# Patient Record
Sex: Female | Born: 1965 | Race: White | Hispanic: No | Marital: Single | Smoking: Never smoker
Health system: Southern US, Community
[De-identification: ages and names within clinical notes are randomized; demographics above are authoritative.]

---

## 1998-05-22 ENCOUNTER — Other Ambulatory Visit: Admission: RE | Admit: 1998-05-22 | Discharge: 1998-05-22 | Payer: Self-pay | Admitting: Obstetrics and Gynecology

## 1999-11-11 ENCOUNTER — Other Ambulatory Visit: Admission: RE | Admit: 1999-11-11 | Discharge: 1999-11-11 | Payer: Self-pay | Admitting: Otolaryngology

## 1999-11-11 ENCOUNTER — Encounter (INDEPENDENT_AMBULATORY_CARE_PROVIDER_SITE_OTHER): Payer: Self-pay

## 2000-03-18 ENCOUNTER — Encounter: Payer: Self-pay | Admitting: *Deleted

## 2000-03-18 ENCOUNTER — Encounter: Admission: RE | Admit: 2000-03-18 | Discharge: 2000-03-18 | Payer: Self-pay | Admitting: *Deleted

## 2003-08-18 ENCOUNTER — Other Ambulatory Visit: Admission: RE | Admit: 2003-08-18 | Discharge: 2003-08-18 | Payer: Self-pay | Admitting: Obstetrics and Gynecology

## 2004-02-11 ENCOUNTER — Emergency Department (HOSPITAL_COMMUNITY): Admission: EM | Admit: 2004-02-11 | Discharge: 2004-02-11 | Payer: Self-pay | Admitting: Family Medicine

## 2004-11-14 ENCOUNTER — Emergency Department (HOSPITAL_COMMUNITY): Admission: EM | Admit: 2004-11-14 | Discharge: 2004-11-14 | Payer: Self-pay | Admitting: Family Medicine

## 2006-03-05 ENCOUNTER — Ambulatory Visit (HOSPITAL_COMMUNITY): Admission: RE | Admit: 2006-03-05 | Discharge: 2006-03-05 | Payer: Self-pay | Admitting: Family Medicine

## 2008-12-06 ENCOUNTER — Ambulatory Visit: Payer: Self-pay | Admitting: Family Medicine

## 2009-01-01 ENCOUNTER — Ambulatory Visit: Payer: Self-pay | Admitting: Family Medicine

## 2009-01-02 ENCOUNTER — Encounter: Admission: RE | Admit: 2009-01-02 | Discharge: 2009-01-02 | Payer: Self-pay | Admitting: Family Medicine

## 2010-02-15 ENCOUNTER — Ambulatory Visit: Payer: Self-pay | Admitting: Family Medicine

## 2010-05-02 NOTE — Assessment & Plan Note (Signed)
Summary: FLU SHOT/EVM  Nurse Visit   Immunizations Administered:  Influenza Vaccine:    Vaccine Type: FLUMIST    Site: NASAL    Mfr: MEDIMMUNE    Dose: 0.2ML    Route: INTRANASAL    Given by: Levonne Spiller EMT-P    Exp. Date: 02/24/2010    Lot #: [id number]    VIS given: 10/23/09 version given February 15, 2010.   Immunizations Administered:  Influenza Vaccine:    Vaccine Type: FLUMIST    Site: NASAL    Mfr: MEDIMMUNE    Dose: 0.2ML    Route: INTRANASAL    Given by: Levonne Spiller EMT-P    Exp. Date: 02/24/2010    Lot #: [id number]    VIS given: 10/23/09 version given February 15, 2010.  Flu Vaccine Consent Questions:    Do you have a history of severe allergic reactions to this vaccine? no    Any prior history of allergic reactions to egg and/or gelatin? no    Do you have a sensitivity to the preservative Thimersol? no    Do you have a past history of Guillan-Barre Syndrome? no    Do you currently have an acute febrile illness? no    Have you ever had a severe reaction to latex? no    Vaccine information given and explained to patient? yes    Are you currently pregnant? no

## 2010-05-02 NOTE — Letter (Signed)
Summary: Generic Letter  [facility]  [address]   [place], [place] [address]   Phone: [phone]  Fax: [phone]    02/15/2010  [facility] [address] [place], [place]  [address]  TO WHOM IT MAY CONCERN,  THE ABOVE PATIENT RECEIVE FLUMIST FLU VACCINATION ON February 15, 2010 at the [facility].   Please call us with questions.      Sincerely,   Standley Dakins MD

## 2010-05-07 ENCOUNTER — Ambulatory Visit: Payer: BC Managed Care – PPO | Attending: Orthopaedic Surgery | Admitting: Rehabilitation

## 2010-05-07 DIAGNOSIS — M25619 Stiffness of unspecified shoulder, not elsewhere classified: Secondary | ICD-10-CM | POA: Insufficient documentation

## 2010-05-07 DIAGNOSIS — M25519 Pain in unspecified shoulder: Secondary | ICD-10-CM | POA: Insufficient documentation

## 2010-05-07 DIAGNOSIS — IMO0001 Reserved for inherently not codable concepts without codable children: Secondary | ICD-10-CM | POA: Insufficient documentation

## 2010-05-07 DIAGNOSIS — R293 Abnormal posture: Secondary | ICD-10-CM | POA: Insufficient documentation

## 2010-05-09 ENCOUNTER — Ambulatory Visit: Payer: BC Managed Care – PPO | Admitting: Physical Therapy

## 2010-05-13 ENCOUNTER — Ambulatory Visit: Payer: BC Managed Care – PPO | Admitting: Physical Therapy

## 2010-05-16 ENCOUNTER — Ambulatory Visit: Payer: BC Managed Care – PPO | Admitting: Physical Therapy

## 2010-05-20 ENCOUNTER — Ambulatory Visit: Payer: BC Managed Care – PPO | Admitting: Physical Therapy

## 2010-05-23 ENCOUNTER — Encounter: Payer: BC Managed Care – PPO | Admitting: Physical Therapy

## 2010-05-28 ENCOUNTER — Ambulatory Visit: Payer: BC Managed Care – PPO | Admitting: Physical Therapy

## 2010-06-12 ENCOUNTER — Encounter: Payer: BC Managed Care – PPO | Admitting: Rehabilitation

## 2010-11-11 ENCOUNTER — Telehealth: Payer: Self-pay | Admitting: Family Medicine

## 2010-11-11 MED ORDER — ACYCLOVIR 5 % EX OINT: TOPICAL_OINTMENT | CUTANEOUS | Status: AC

## 2010-11-11 NOTE — Telephone Encounter (Signed)
Zovirax renewed

## 2011-12-17 ENCOUNTER — Telehealth: Payer: Self-pay | Admitting: Family Medicine

## 2011-12-18 NOTE — Telephone Encounter (Signed)
Called pt left message to call back with strength so it can be sent in

## 2011-12-18 NOTE — Telephone Encounter (Signed)
Find out the strength and call it in.

## 2011-12-19 ENCOUNTER — Telehealth: Payer: Self-pay | Admitting: Family Medicine

## 2011-12-19 MED ORDER — TRIAMCINOLONE ACETONIDE 0.1 % EX CREA: TOPICAL_CREAM | Freq: Two times a day (BID) | CUTANEOUS | Status: DC

## 2011-12-19 NOTE — Telephone Encounter (Signed)
Sent cream in

## 2012-05-15 ENCOUNTER — Other Ambulatory Visit: Payer: Self-pay

## 2013-02-03 ENCOUNTER — Other Ambulatory Visit: Payer: Self-pay

## 2013-09-05 ENCOUNTER — Other Ambulatory Visit: Payer: Self-pay | Admitting: Occupational Medicine

## 2013-09-05 ENCOUNTER — Ambulatory Visit: Payer: Self-pay

## 2013-09-05 DIAGNOSIS — R52 Pain, unspecified: Secondary | ICD-10-CM

## 2013-09-07 ENCOUNTER — Other Ambulatory Visit: Payer: Self-pay | Admitting: Family Medicine

## 2013-09-07 MED ORDER — TRIAMCINOLONE ACETONIDE 0.1 % EX CREA: TOPICAL_CREAM | Freq: Two times a day (BID) | CUTANEOUS | Status: DC

## 2014-12-19 ENCOUNTER — Other Ambulatory Visit: Payer: Self-pay | Admitting: Podiatry

## 2014-12-19 DIAGNOSIS — M79671 Pain in right foot: Secondary | ICD-10-CM

## 2014-12-19 DIAGNOSIS — M25571 Pain in right ankle and joints of right foot: Secondary | ICD-10-CM

## 2014-12-28 ENCOUNTER — Ambulatory Visit
Admission: RE | Admit: 2014-12-28 | Discharge: 2014-12-28 | Disposition: A | Discharge status: Home / Self Care | Payer: BLUE CROSS/BLUE SHIELD | Source: Ambulatory Visit | Attending: Podiatry | Admitting: Podiatry

## 2014-12-28 DIAGNOSIS — M25571 Pain in right ankle and joints of right foot: Secondary | ICD-10-CM

## 2014-12-28 DIAGNOSIS — M79671 Pain in right foot: Secondary | ICD-10-CM

## 2016-06-18 ENCOUNTER — Ambulatory Visit (INDEPENDENT_AMBULATORY_CARE_PROVIDER_SITE_OTHER): Payer: BLUE CROSS/BLUE SHIELD | Admitting: Family Medicine

## 2016-06-18 ENCOUNTER — Encounter: Payer: Self-pay | Admitting: Family Medicine

## 2016-06-18 VITALS — BP 120/76 | HR 83 | Ht 68.5 in | Wt 209.0 lb

## 2016-06-18 DIAGNOSIS — L309 Dermatitis, unspecified: Secondary | ICD-10-CM

## 2016-06-18 MED ORDER — CRISABOROLE 2 % EX OINT: 1.0000 "application " | TOPICAL_OINTMENT | Freq: Two times a day (BID) | CUTANEOUS | 11 refills | Status: AC

## 2016-06-18 NOTE — Progress Notes (Signed)
   Subjective:    Patient ID: Erika Yu, female    DOB: June 30, 1965, 51 y.o.   MRN: [id number]  HPI  Erika Yu a previous patient of mine who wants to come back to my care. 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 She has been using triamcinolone to hp with her underlying eczema which has helped elected try a new topical medication.   Review of Systems     Objective:   Physical Exam  Alert and in no distress. Hands appear normal.       Assessment & Plan:  Eczema, unspecified type - Plan: Crisaborole (EUCRISA) 2 % OINT  I will try her on the new medication. Also recommend she come back for complete exam as she is now postmenopausal. She will set up an appointment. She is to bring in her immunization update information.

## 2017-03-07 ENCOUNTER — Ambulatory Visit (HOSPITAL_COMMUNITY)
Admission: EM | Admit: 2017-03-07 | Discharge: 2017-03-07 | Disposition: A | Discharge status: Home / Self Care | Payer: BLUE CROSS/BLUE SHIELD | Attending: Physician Assistant | Admitting: Physician Assistant

## 2017-03-07 ENCOUNTER — Encounter (HOSPITAL_COMMUNITY): Payer: Self-pay | Admitting: *Deleted

## 2017-03-07 DIAGNOSIS — R3915 Urgency of urination: Secondary | ICD-10-CM | POA: Insufficient documentation

## 2017-03-07 DIAGNOSIS — R300 Dysuria: Secondary | ICD-10-CM | POA: Insufficient documentation

## 2017-03-07 DIAGNOSIS — N3090 Cystitis, unspecified without hematuria: Secondary | ICD-10-CM | POA: Diagnosis not present

## 2017-03-07 LAB — POCT URINALYSIS DIP (DEVICE)
GLUCOSE, UA: 250 mg/dL — AB
KETONES UR: 15 mg/dL — AB
NITRITE: POSITIVE — AB
PROTEIN: 100 mg/dL — AB
Specific Gravity, Urine: 1.01 (ref 1.005–1.030)
UROBILINOGEN UA: 4 mg/dL — AB (ref 0.0–1.0)
pH: 5 (ref 5.0–8.0)

## 2017-03-07 MED ORDER — CEPHALEXIN 500 MG PO CAPS: 500.0000 mg | ORAL_CAPSULE | Freq: Two times a day (BID) | ORAL | 0 refills | Status: AC

## 2017-03-07 NOTE — ED Provider Notes (Signed)
[facility]    CSN: [id number] Arrival date & time: 03/07/17  1335     History   Chief Complaint Chief Complaint  Patient presents with  . Urinary Urgency  . Dysuria    HPI ROLLA SERVIDIO is a 51 y.o. female.   51 year old female comes in for 1 week history of UTI symptoms with 1 day of dysuria. States she felt symptoms starting 1 week ago, thought it may be due to irritation as she was traveling and was often in wet bathing suit. She increased water intake and started cranberry juice. This morning, she woke up with worsening symptoms and dysuria. She started AZO with some relief of symptoms. She denies abdominal pain, nausea, vomiting. Denies fever, chills, night sweats.        History reviewed. No pertinent past medical history.  There are no active problems to display for this patient.   History reviewed. No pertinent surgical history.  OB History    No data available       Home Medications    Prior to Admission medications   Medication Sig Start Date End Date Taking? Authorizing Provider  Ascorbic Acid (VITAMIN C) 1000 MG tablet Take 1,000 mg by mouth daily.   Yes [provider]  aspirin EC 81 MG tablet Take 81 mg by mouth daily.   Yes [provider]  Calcium Carb-Cholecalciferol (CALCIUM-VITAMIN D) 500-200 MG-UNIT tablet Take 1 tablet by mouth daily.   Yes [provider]  glucosamine-chondroitin 500-400 MG tablet Take 1 tablet by mouth 3 (three) times daily.   Yes [provider]  loratadine (CLARITIN) 10 MG tablet Take 10 mg by mouth daily.   Yes [provider]  Multiple Vitamins-Minerals (MULTIVITAMIN WITH MINERALS) tablet Take 1 tablet by mouth daily.   Yes [provider]  cephALEXin (KEFLEX) 500 MG capsule Take 1 capsule (500 mg total) by mouth 2 (two) times daily for 7 days. 03/07/17 03/14/17  Ok Edwards, PA-C  Crisaborole (EUCRISA) 2 % OINT Apply 1 application topically 2 (two) times  daily. 06/18/16   Denita Lung, MD    Family History History reviewed. No pertinent family history.  Social History Social History   Tobacco Use  . Smoking status: Never Smoker  . Smokeless tobacco: Never Used  Substance Use Topics  . Alcohol use: Not on file  . Drug use: Not on file     Allergies   Citric acid   Review of Systems Review of Systems  Reason unable to perform ROS: See HPI as above.     Physical Exam Triage Vital Signs ED Triage Vitals [03/07/17 1504]  Enc Vitals Group     BP 129/81     Pulse Rate 81     Resp 16     Temp 98.1 F (36.7 C)     Temp Source Oral     SpO2 100 %     Weight      Height      Head Circumference      Peak Flow      Pain Score      Pain Loc      Pain Edu?      Excl. in [place]?    No data found.  Updated Vital Signs BP 129/81 (BP Location: Left Arm)   Pulse 81   Temp 98.1 F (36.7 C) (Oral)   Resp 16   SpO2 100%   Physical Exam  Constitutional: She is  oriented to person, place, and time. She appears well-developed and well-nourished. No distress.  Eyes: Conjunctivae are normal. Pupils are equal, round, and reactive to light.  Cardiovascular: Normal rate, regular rhythm and normal heart sounds. Exam reveals no gallop and no friction rub.  No murmur heard. Pulmonary/Chest: Effort normal and breath sounds normal. She has no wheezes. She has no rales.  Abdominal: Soft. Bowel sounds are normal. She exhibits no distension. There is no tenderness. There is no rebound, no guarding and no CVA tenderness.  Neurological: She is alert and oriented to person, place, and time.  Skin: Skin is warm and dry.     UC Treatments / Results  Labs (all labs ordered are listed, but only abnormal results are displayed) Labs Reviewed  POCT URINALYSIS DIP (DEVICE) - Abnormal; Notable for the following components:      Result Value   Glucose, UA 250 (*)    Bilirubin Urine SMALL (*)    Ketones, ur 15 (*)    Hgb urine dipstick  MODERATE (*)    Protein, ur 100 (*)    Urobilinogen, UA 4.0 (*)    Nitrite POSITIVE (*)    Leukocytes, UA LARGE (*)    All other components within normal limits  URINE CULTURE    EKG  EKG Interpretation None       Radiology No results found.  Procedures Procedures (including critical care time)  Medications Ordered in UC Medications - No data to display   Initial Impression / Assessment and Plan / UC Course  I have reviewed the triage vital signs and the nursing notes.  Pertinent labs & imaging results that were available during my care of the patient were reviewed by me and considered in my medical decision making (see chart for details).    Urine dipstick positive for UTI. Start antibiotics as directed. Push fluids. Urine culture sent. Return precautions given.   Final Clinical Impressions(s) / UC Diagnoses   Final diagnoses:  Cystitis    ED Discharge Orders        Ordered    cephALEXin (KEFLEX) 500 MG capsule  2 times daily     03/07/17 1539        Ok Edwards, [place] 03/07/17 1840

## 2017-03-07 NOTE — Discharge Instructions (Signed)
Your urine was positive for an urinary tract infection. Start keflex as directed. Keep hydrated, your urine should be clear to pale yellow in color. Monitor for any worsening of symptoms, fever, worsening abdominal pain, nausea/vomiting, flank pain, follow up for reevaluation.  ° °

## 2017-03-07 NOTE — ED Notes (Signed)
Patient sent to the restroom, obtained a clean & dirty specimen. Specimens in lab

## 2017-03-07 NOTE — ED Triage Notes (Signed)
Patient reports urinary frequency and dysuria. Denies abdominal pain, flank pain, or fever.

## 2017-03-09 LAB — URINE CULTURE: Culture: 50000 — AB

## 2017-03-17 ENCOUNTER — Telehealth: Payer: Self-pay | Admitting: Family Medicine

## 2017-03-17 MED ORDER — NITROFURANTOIN MONOHYD MACRO 100 MG PO CAPS: 100.0000 mg | ORAL_CAPSULE | Freq: Two times a day (BID) | ORAL | 0 refills | Status: DC

## 2017-03-17 NOTE — Telephone Encounter (Signed)
  Finished meds for uti on Friday, she was feeling 100% better earlier last week . Symptoms started back today  Please call  Started OTC pyridium this morning

## 2017-03-17 NOTE — Telephone Encounter (Signed)
Let her know that I called the medication and but would like her to come in this week to recheck her urine.  It did show that she was spilling sugar so we need to follow-up on that

## 2017-03-18 ENCOUNTER — Telehealth: Payer: Self-pay | Admitting: Family Medicine

## 2017-03-18 NOTE — Telephone Encounter (Signed)
Lmtcb.

## 2017-03-18 NOTE — Telephone Encounter (Signed)
Pt aware. Erika Yu

## 2017-03-20 NOTE — Telephone Encounter (Signed)
Error

## 2018-01-18 ENCOUNTER — Encounter: Payer: Self-pay | Admitting: Family Medicine

## 2018-01-18 ENCOUNTER — Ambulatory Visit: Payer: BLUE CROSS/BLUE SHIELD | Admitting: Family Medicine

## 2018-01-18 VITALS — BP 110/76 | HR 70 | Temp 98.0°F | Wt 207.2 lb

## 2018-01-18 DIAGNOSIS — D563 Thalassemia minor: Secondary | ICD-10-CM | POA: Diagnosis not present

## 2018-01-18 DIAGNOSIS — R8290 Unspecified abnormal findings in urine: Secondary | ICD-10-CM

## 2018-01-18 DIAGNOSIS — N3000 Acute cystitis without hematuria: Secondary | ICD-10-CM | POA: Diagnosis not present

## 2018-01-18 DIAGNOSIS — Z1211 Encounter for screening for malignant neoplasm of colon: Secondary | ICD-10-CM | POA: Diagnosis not present

## 2018-01-18 LAB — CBC WITH DIFFERENTIAL/PLATELET
Basophils Absolute: 0.1 10*3/uL (ref 0.0–0.2)
Basos: 1 %
EOS (ABSOLUTE): 0.1 10*3/uL (ref 0.0–0.4)
EOS: 1 %
HEMATOCRIT: 33.8 % — AB (ref 34.0–46.6)
HEMOGLOBIN: 10.4 g/dL — AB (ref 11.1–15.9)
Immature Grans (Abs): 0 10*3/uL (ref 0.0–0.1)
Immature Granulocytes: 0 %
LYMPHS ABS: 1.9 10*3/uL (ref 0.7–3.1)
Lymphs: 26 %
MCH: 19.1 pg — AB (ref 26.6–33.0)
MCHC: 30.8 g/dL — AB (ref 31.5–35.7)
MCV: 62 fL — ABNORMAL LOW (ref 79–97)
MONOCYTES: 10 %
MONOS ABS: 0.7 10*3/uL (ref 0.1–0.9)
NEUTROS ABS: 4.5 10*3/uL (ref 1.4–7.0)
Neutrophils: 62 %
Platelets: 435 10*3/uL (ref 150–450)
RBC: 5.45 x10E6/uL — ABNORMAL HIGH (ref 3.77–5.28)
RDW: 15 % (ref 12.3–15.4)
WBC: 7.3 10*3/uL (ref 3.4–10.8)

## 2018-01-18 LAB — COMPREHENSIVE METABOLIC PANEL
ALBUMIN: 4.6 g/dL (ref 3.5–5.5)
ALK PHOS: 83 IU/L (ref 39–117)
ALT: 17 IU/L (ref 0–32)
AST: 17 IU/L (ref 0–40)
Albumin/Globulin Ratio: 2.2 (ref 1.2–2.2)
BILIRUBIN TOTAL: 0.5 mg/dL (ref 0.0–1.2)
BUN / CREAT RATIO: 19 (ref 9–23)
BUN: 21 mg/dL (ref 6–24)
CHLORIDE: 102 mmol/L (ref 96–106)
CO2: 22 mmol/L (ref 20–29)
Calcium: 9.6 mg/dL (ref 8.7–10.2)
Creatinine, Ser: 1.09 mg/dL — ABNORMAL HIGH (ref 0.57–1.00)
GFR calc Af Amer: 67 mL/min/{1.73_m2} (ref >59)
GFR calc non Af Amer: 59 mL/min/{1.73_m2} — ABNORMAL LOW (ref >59)
GLOBULIN, TOTAL: 2.1 g/dL (ref 1.5–4.5)
Glucose: 101 mg/dL — ABNORMAL HIGH (ref 65–99)
Potassium: 3.7 mmol/L (ref 3.5–5.2)
SODIUM: 141 mmol/L (ref 134–144)
Total Protein: 6.7 g/dL (ref 6.0–8.5)

## 2018-01-18 LAB — POCT URINALYSIS DIP (PROADVANTAGE DEVICE)
Blood, UA: NEGATIVE
Glucose, UA: 100 mg/dL — AB
Nitrite, UA: POSITIVE — AB
PROTEIN UA: NEGATIVE mg/dL
Specific Gravity, Urine: 1.2
pH, UA: 5 (ref 5.0–8.0)

## 2018-01-18 MED ORDER — NITROFURANTOIN MONOHYD MACRO 100 MG PO CAPS: 100.0000 mg | ORAL_CAPSULE | Freq: Two times a day (BID) | ORAL | 0 refills | Status: DC

## 2018-01-18 NOTE — Addendum Note (Signed)
Addended by: Elyse Jarvis on: 01/18/2018 01:35 PM   Modules accepted: Orders

## 2018-01-18 NOTE — Progress Notes (Signed)
   Subjective:    Patient ID: Erika Yu, female    DOB: 03/01/1966, 52 y.o.   MRN: [id number]  HPI She has a 3-day history of dysuria, slight nausea with frequency.  She has a previous history of UTI.  No fever, chills, abdominal pain.  So I do not think, that there is she also has noted some darkening of the color of her urine.  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  She does take a multivitamin but does limit her iron intake.  She would also like colon cancer screening.   Review of Systems     Objective:   Physical Exam Alert and in no distress otherwise not examined.  Urine dipstick was positive for leukocytes, nitrite, bilirubin and urobilinogen.       Assessment & Plan:  Thalassemia minor - Plan: CBC with Differential/Platelet, Comprehensive metabolic panel  Acute cystitis without hematuria - Plan: nitrofurantoin, macrocrystal-monohydrate, (MACROBID) 100 MG capsule  Abnormal urinalysis - Plan: CBC with Differential/Platelet, Comprehensive metabolic panel  Screening for colon cancer - Plan: Cologuard Follow-up as needed.

## 2018-01-19 ENCOUNTER — Encounter: Payer: Self-pay | Admitting: Family Medicine

## 2018-02-09 LAB — COLOGUARD: COLOGUARD: NEGATIVE

## 2018-02-10 ENCOUNTER — Telehealth: Payer: Self-pay

## 2018-02-10 NOTE — Telephone Encounter (Signed)
Called pt to advise cologuard results of negative. [place]

## 2018-06-10 ENCOUNTER — Other Ambulatory Visit: Payer: Self-pay

## 2018-06-10 ENCOUNTER — Ambulatory Visit (INDEPENDENT_AMBULATORY_CARE_PROVIDER_SITE_OTHER): Payer: BLUE CROSS/BLUE SHIELD | Admitting: Family Medicine

## 2018-06-10 ENCOUNTER — Encounter: Payer: Self-pay | Admitting: Family Medicine

## 2018-06-10 VITALS — BP 120/82 | HR 75 | Temp 98.0°F | Resp 16 | Wt 205.0 lb

## 2018-06-10 DIAGNOSIS — M720 Palmar fascial fibromatosis [Dupuytren]: Secondary | ICD-10-CM | POA: Diagnosis not present

## 2018-06-10 NOTE — Progress Notes (Signed)
   Subjective:    Patient ID: Erika Yu, female    DOB: Jan 29, 1966, 53 y.o.   MRN: [id number]  HPI She is here for evaluation of a lump in her left hand palmar surface.  It is now starting to interfere with her ADLs and she would like it definitively taken care of.  She also notes difficulty with tingling sensation in the fourth and fifth fingers on that side especially when she bends at the elbow.  This usually is relieved when she changes position.   Review of Systems     Objective:   Physical Exam Alert and in no distress.  8.5 cm slightly tender lesion is noted in the palm of the hand.  It does not seem to be attached to the underlying tendons.       Assessment & Plan:  Dupuytren's contracture of left hand - Plan: Ambulatory referral to Orthopedic Surgery Since she is having some pain with this and interfering with her ADLs, I will refer to hand surgery for further definitive care. I also informed her that the symptoms she is having with her fourth and fifth fingers are probably from ulnar nerve compression mainly at the elbow and to keep her elbow as straight as possible and avoid bending more than 90 degrees.

## 2018-08-18 ENCOUNTER — Other Ambulatory Visit: Payer: Self-pay | Admitting: Family Medicine

## 2018-08-18 ENCOUNTER — Other Ambulatory Visit: Payer: Self-pay

## 2018-08-18 ENCOUNTER — Ambulatory Visit: Payer: Self-pay

## 2018-08-18 DIAGNOSIS — M79622 Pain in left upper arm: Secondary | ICD-10-CM

## 2018-08-18 DIAGNOSIS — M898X2 Other specified disorders of bone, upper arm: Secondary | ICD-10-CM

## 2018-09-16 ENCOUNTER — Ambulatory Visit: Payer: BC Managed Care – PPO | Admitting: Medical

## 2018-09-16 ENCOUNTER — Ambulatory Visit
Admission: RE | Admit: 2018-09-16 | Discharge: 2018-09-16 | Disposition: A | Discharge status: Home / Self Care | Payer: BLUE CROSS/BLUE SHIELD | Source: Ambulatory Visit | Attending: Medical | Admitting: Medical

## 2018-09-16 ENCOUNTER — Encounter: Payer: Self-pay | Admitting: Medical

## 2018-09-16 ENCOUNTER — Other Ambulatory Visit: Payer: Self-pay

## 2018-09-16 VITALS — BP 120/74 | HR 70 | Temp 98.8°F | Resp 16 | Ht 68.0 in | Wt 203.2 lb

## 2018-09-16 DIAGNOSIS — M25512 Pain in left shoulder: Secondary | ICD-10-CM | POA: Diagnosis not present

## 2018-09-16 DIAGNOSIS — M25612 Stiffness of left shoulder, not elsewhere classified: Secondary | ICD-10-CM | POA: Diagnosis not present

## 2018-09-16 DIAGNOSIS — G8929 Other chronic pain: Secondary | ICD-10-CM | POA: Diagnosis not present

## 2018-09-16 DIAGNOSIS — M79622 Pain in left upper arm: Secondary | ICD-10-CM

## 2018-09-16 NOTE — Progress Notes (Signed)
Subjective: Chief Complaint  Patient presents with  . left arm pain    left arm pain upper arm shoulder pain X Oct 2019   Here for left arm pain, chronic.   Since her flu shot in october 2019, she has had some left arm issues.    initially had deltoid pain after flu shot.   But has continued to have some arm issues.  Splints her self to avoid moving the shoulder at times.  avoids a lot of over head motion to avoid pain.   Gets a stinger type pain in the shoulder.  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  Takes Ibuprofen daily, but doesn't want to do this fore ever.  Feels pressure points in the shoulder.  Doesn't hurt when she doesn't move it.  Uses TENS unit which seems to help dull the pain at times.  Works for [employer].  Went to [facility] for same recently 08/18/18, did course of prednisone dose pack, and they thought it was tendinitis.   Workers comp felt it was not related to flu shot or work related.   Workers comp released her, advised f/u with PCP.  movement makes it work in certain spots.    Lying on it hurts.   Winging it to certain spots helps, and not moving helps.   Has tried ice and heat, but not much improvement.     She is left-handed   ROS as in subjective   Objective: BP 120/74   Pulse 70   Temp 98.8 F (37.1 C) (Oral)   Resp 16   Ht 5\' 8"  (1.727 m)   Wt 203 lb 3.2 oz (92.2 kg)   SpO2 98%   BMI 30.90 kg/m   Gen: wd, wn, nad, white female Skin unremarkable Neck normal range of motion no mass no thyromegaly vomiting Left arm tender with deep palpation along anterior and anterior medial bicep and midshaft of arm, slight tenderness with biceps origin otherwise arms nontender to palpation, there is decreased range of motion with internal and external range of motion of shoulder, pain with empty can test, pain with apprehension test, mild pain with crossover test, pain with resisted abduction test, pain with Neer's test, otherwise arm  range of motion within normal limits, nontender otherwise, no swelling, no deformity other than Dupuytren's contracture of hand, rest of extremities unremarkable  Reviewed Humerus xray, left 08/18/18 Impression: Negative, no evidence of fracture or other focal bone lesions.  Soft tissues are unremarkable    Assessment: Encounter Diagnoses  Name Primary?  . Left upper arm pain Yes  . Chronic left shoulder pain   . Decreased ROM of left shoulder     Plan: We discussed her symptoms and concerns.  I do not think a flu shot in the fall had anything to do with her symptoms at this point  I suspect a rotator cuff issue.  We will send her for shoulder x-ray just to rule out other issues.  We will likely send her to physical therapy    Namita was seen today for left arm pain.  Diagnoses and all orders for this visit:  Left upper arm pain -     DG Shoulder Left; Future -     Ambulatory referral to Physical Therapy  Chronic left shoulder pain -     DG Shoulder Left; Future -     Ambulatory referral to Physical Therapy  Decreased ROM of left  shoulder -     DG Shoulder Left; Future -     Ambulatory referral to Physical Therapy

## 2018-09-16 NOTE — Patient Instructions (Signed)
Please go to [facility] for your left shoulder xray.   Their hours are 8am - 4:30 pm Monday - Friday.  Take your insurance card with you.  [facility] [phone]  [address], [address] [place], [place] [address]  [address] ,  [address]  

## 2018-09-30 ENCOUNTER — Other Ambulatory Visit: Payer: Self-pay | Admitting: Orthopedic Surgery

## 2018-09-30 DIAGNOSIS — R2232 Localized swelling, mass and lump, left upper limb: Secondary | ICD-10-CM

## 2018-10-27 ENCOUNTER — Ambulatory Visit
Admission: RE | Admit: 2018-10-27 | Discharge: 2018-10-27 | Disposition: A | Discharge status: Home / Self Care | Payer: BLUE CROSS/BLUE SHIELD | Source: Ambulatory Visit | Attending: Orthopedic Surgery | Admitting: Orthopedic Surgery

## 2018-10-27 DIAGNOSIS — R2232 Localized swelling, mass and lump, left upper limb: Secondary | ICD-10-CM

## 2018-11-08 ENCOUNTER — Encounter: Payer: Self-pay | Admitting: Family Medicine

## 2018-11-11 ENCOUNTER — Other Ambulatory Visit: Payer: Self-pay | Admitting: Orthopaedic Surgery

## 2018-11-11 ENCOUNTER — Encounter: Payer: Self-pay | Admitting: Orthopaedic Surgery

## 2018-11-11 ENCOUNTER — Ambulatory Visit (INDEPENDENT_AMBULATORY_CARE_PROVIDER_SITE_OTHER): Payer: BLUE CROSS/BLUE SHIELD | Admitting: Orthopaedic Surgery

## 2018-11-11 DIAGNOSIS — M25512 Pain in left shoulder: Secondary | ICD-10-CM

## 2018-11-11 DIAGNOSIS — G8929 Other chronic pain: Secondary | ICD-10-CM

## 2018-11-11 DIAGNOSIS — M7502 Adhesive capsulitis of left shoulder: Secondary | ICD-10-CM

## 2018-11-11 MED ORDER — HYDROCODONE-ACETAMINOPHEN 5-325 MG PO TABS: 1.0000 | ORAL_TABLET | Freq: Four times a day (QID) | ORAL | 0 refills | Status: DC | PRN

## 2018-11-11 MED ORDER — METHYLPREDNISOLONE 4 MG PO TABS: ORAL_TABLET | ORAL | 0 refills | Status: DC

## 2018-11-11 NOTE — Progress Notes (Signed)
Office Visit Note   Patient: Erika Yu           Date of Birth: 30-Mar-1966           MRN: [id number] Visit Date: 11/11/2018              Requested by: Denita Lung, MD [place],  [place] [address] PCP: Denita Lung, MD   Assessment & Plan: Visit Diagnoses:  1. Chronic left shoulder pain   2. Adhesive capsulitis of left shoulder     Plan: Given the severity of her left frozen shoulder we are recommending a manipulation under anesthesia followed by an arthroscopic intervention with capsular release with extensive debridement and subacromial decompression.  Given the failure of conservative treatment and given her significant limitations in mobility of that left shoulder this is the next obvious step and is clinically warranted at this point.  We had a long and thorough discussion about what the surgery involves.  The risk and benefits were explained in detail.  We would certainly want her to get into physical therapy even in the afternoon of surgery and for the next week to get the shoulder moving.  We would then see her back at 1 week postoperative for suture removal.  All question concerns were answered addressed.  We will work on getting surgery scheduled.  Follow-Up Instructions: Return for 1 week post-op.   Orders:  No orders of the defined types were placed in this encounter.  No orders of the defined types were placed in this encounter.     Procedures: No procedures performed   Clinical Data: No additional findings.   Subjective: Chief Complaint  Patient presents with   Left Shoulder - Pain  The patient comes in today with severe shoulder arthrofibrosis and adhesive capsulitis.  This is of the left shoulder.  This is been going on for about 10 months now.  She is tried a long course of physical therapy to try to get her shoulder moving better.  I have all the notes from therapy that showed the extent of her progress and lack of progress  with therapy.  Her pain is daily.  This all started after having a flu shot which can certainly trigger inflammatory spots near the shoulder and cause pain that then led to her not moving her shoulder which then eventually led to the adhesive capsulitis.  She denies any neck pain and denies any numbness and tingling in her hand.  She has had similar issues with her right shoulder in the past that had to be treated eventually with surgery.  HPI  Review of Systems She currently denies any headache, chest pain, shortness of breath, fever, chills, nausea, vomiting  Objective: Vital Signs: There were no vitals taken for this visit.  Physical Exam She is alert and oriented x3 and in no acute distress Ortho Exam Examination of her left shoulder shows severe limitations in her motion of the shoulder.  I cannot really abduct her much past 90 degrees.  Her external rotation is severely limited.  Her internal rotation with adduction is only at the level of her gluteus area.  Her right shoulder motion is full and normal. Specialty Comments:  No specialty comments available.  Imaging: No results found.   PMFS History: Patient Active Problem List   Diagnosis Date Noted   Adhesive capsulitis of left shoulder 11/11/2018   Left upper arm pain 09/16/2018   Chronic left shoulder pain 09/16/2018  Decreased ROM of left shoulder 09/16/2018   History reviewed. No pertinent past medical history.  History reviewed. No pertinent family history.  History reviewed. No pertinent surgical history. Social History   Occupational History   Not on file  Tobacco Use   Smoking status: Never Smoker   Smokeless tobacco: Never Used  Substance and Sexual Activity   Alcohol use: Not on file   Drug use: Not on file   Sexual activity: Not on file

## 2018-11-25 ENCOUNTER — Other Ambulatory Visit: Payer: Self-pay | Admitting: Orthopaedic Surgery

## 2018-11-25 DIAGNOSIS — M24612 Ankylosis, left shoulder: Secondary | ICD-10-CM

## 2018-11-25 MED ORDER — OXYCODONE HCL 5 MG PO TABS: 5.0000 mg | ORAL_TABLET | Freq: Four times a day (QID) | ORAL | 0 refills | Status: DC | PRN

## 2018-12-02 ENCOUNTER — Ambulatory Visit (INDEPENDENT_AMBULATORY_CARE_PROVIDER_SITE_OTHER): Payer: BC Managed Care – PPO | Admitting: Orthopaedic Surgery

## 2018-12-02 ENCOUNTER — Encounter: Payer: Self-pay | Admitting: Orthopaedic Surgery

## 2018-12-02 DIAGNOSIS — M7502 Adhesive capsulitis of left shoulder: Secondary | ICD-10-CM

## 2018-12-02 DIAGNOSIS — Z9889 Other specified postprocedural states: Secondary | ICD-10-CM

## 2018-12-02 MED ORDER — OXYCODONE HCL 5 MG PO TABS: 5.0000 mg | ORAL_TABLET | Freq: Four times a day (QID) | ORAL | 0 refills | Status: DC | PRN

## 2018-12-02 MED ORDER — METHYLPREDNISOLONE 4 MG PO TABS: ORAL_TABLET | ORAL | 0 refills | Status: DC

## 2018-12-02 NOTE — Progress Notes (Signed)
Patient is 1 week out from manipulation under anesthesia of her left shoulder due to arthrofibrosis.  We also found significant synovitis in the glenohumeral joint and bursitis in the cervical space with tendinosis of the rotator cuff.  This is something that had slowly worsened over a year.  I was pleased with the manipulation aspect of her surgery in terms of getting her motion back.  She has been in aggressive physical therapy since then and has not gotten her full motion back as of yet.  She is taking anti-inflammatories and pain medication.  Her small incisions look good over her left shoulder and sutures have been removed.  There is still limitations with forward flexion and abduction of her left shoulder but there is some improvements from what she had preoperative.  She will continue aggressive physical therapy.  I will send in some more oxycodone to try to help with pain control as she is going through therapy.  We will see her back in 4 weeks to see how she is doing overall.  All question concerns were answered and addressed.  In 4 weeks from now we may end up recommending an intra-articular steroid injection if we feel that that may help decrease the inflammation and improve her mobility.  We will see how she looks in 4 weeks.

## 2018-12-27 ENCOUNTER — Encounter: Payer: Self-pay | Admitting: Orthopaedic Surgery

## 2018-12-27 ENCOUNTER — Ambulatory Visit (INDEPENDENT_AMBULATORY_CARE_PROVIDER_SITE_OTHER): Payer: BC Managed Care – PPO | Admitting: Orthopaedic Surgery

## 2018-12-27 DIAGNOSIS — Z9889 Other specified postprocedural states: Secondary | ICD-10-CM

## 2018-12-27 DIAGNOSIS — M7502 Adhesive capsulitis of left shoulder: Secondary | ICD-10-CM | POA: Diagnosis not present

## 2018-12-27 MED ORDER — CYCLOBENZAPRINE HCL 10 MG PO TABS: 10.0000 mg | ORAL_TABLET | Freq: Two times a day (BID) | ORAL | 1 refills | Status: DC | PRN

## 2018-12-27 NOTE — Progress Notes (Signed)
Subjective: Patient is here for ultrasound-guided intra-articular left glenohumeral injection.  Persistent adhesive capsulitis status post manipulation.  Objective: Left shoulder: Abduction limited to about 45 degrees.  Internal and external rotation limited to about the same.  Pain with ranging.  Procedure: Ultrasound-guided left glenohumeral injection: After sterile prep with Betadine, injected 8 cc 1% lidocaine without epinephrine and 40 mg methylprednisolone using a 22-gauge spinal needle, passing the needle through approach into the glenohumeral joint.  Injectate was seen filling the joint capsule.  She had immediate improvement in pain as well as in range of motion.  She will follow-up with Dr. Ninfa Linden as directed.

## 2018-12-27 NOTE — Progress Notes (Signed)
The patient is now about 4 weeks status post a left shoulder manipulation under anesthesia with an arthroscopy with extensive debridement and capsular release.  She is working on getting her motion back with therapy.  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  She had a similar episode of arthrofibrosis and frozen shoulder with her right shoulder that did very well after surgery.  She still having some problems sleeping at night and is been working on trying to get her motion back.  On examination she still has significant stiffness of her left shoulder but there is some improvements from her last visit.  I talked her in detail and I do feel that she would benefit from an intra-articular ultrasound-guided steroid injection into her left shoulder joint.  I am going to have Dr. Junius Roads consult on her today for this injection.  She agrees with trying this as well.  I will send in some Flexeril as a muscle relaxant for her.  I will then see her back myself in 4 weeks to see how she is doing overall.  She will continue her therapy as well.  All question concerns were answered and addressed.

## 2019-01-24 ENCOUNTER — Ambulatory Visit: Payer: BC Managed Care – PPO | Admitting: Orthopaedic Surgery

## 2019-02-03 ENCOUNTER — Ambulatory Visit (INDEPENDENT_AMBULATORY_CARE_PROVIDER_SITE_OTHER): Payer: BC Managed Care – PPO | Admitting: Orthopaedic Surgery

## 2019-02-03 ENCOUNTER — Encounter: Payer: Self-pay | Admitting: Orthopaedic Surgery

## 2019-02-03 ENCOUNTER — Other Ambulatory Visit: Payer: Self-pay | Admitting: Orthopaedic Surgery

## 2019-02-03 DIAGNOSIS — M7502 Adhesive capsulitis of left shoulder: Secondary | ICD-10-CM

## 2019-02-03 DIAGNOSIS — Z9889 Other specified postprocedural states: Secondary | ICD-10-CM

## 2019-02-03 NOTE — Progress Notes (Signed)
The patient is in continued follow-up having dealt with adhesive capsulitis of the left shoulder.  We took her to the operating room in late August for manipulation under anesthesia of the left shoulder and an arthroscopic capsular release with debridement.  At her last visit recently we had Dr. Junius Roads provide an intra-articular steroid injection in her left shoulder.  She has been through physical therapy and is now back in swimming.  She reports that the injection intra-articular helped and her range of motion is much better.  There is some discomfort and she still needs an occasional Flexeril.  On exam her forward flexion is almost full.  She still has some limitations in the extremes of forward flexion abduction but is much improved from what her preoperative state was.  Her internal rotation with adduction is to the mid lumbar spine which is better.  She will continue to push herself through her home therapy program.  She is done with outpatient physical therapy..  She did let me provide a small steroid injection of 1 cc of lidocaine and 1 cc of Depo-Medrol just off the lateral edge of the acromion where she is experiencing some pain and she tolerated this well.  All question concerns were answered and addressed.  She will continue her swimming regimen and pushing her shoulder through the home exercise program.  Follow-up will be as needed.

## 2019-02-03 NOTE — Telephone Encounter (Signed)
Please advise 

## 2019-03-14 ENCOUNTER — Other Ambulatory Visit: Payer: Self-pay

## 2019-03-14 MED ORDER — CYCLOBENZAPRINE HCL 10 MG PO TABS: 10.0000 mg | ORAL_TABLET | Freq: Two times a day (BID) | ORAL | 1 refills | Status: DC | PRN

## 2019-04-27 ENCOUNTER — Other Ambulatory Visit: Payer: Self-pay | Admitting: Orthopaedic Surgery

## 2020-05-07 ENCOUNTER — Other Ambulatory Visit: Payer: Self-pay

## 2020-05-07 ENCOUNTER — Ambulatory Visit: Payer: No Typology Code available for payment source | Admitting: Family Medicine

## 2020-05-07 ENCOUNTER — Encounter: Payer: Self-pay | Admitting: Family Medicine

## 2020-05-07 VITALS — BP 120/76 | HR 73 | Temp 98.3°F | Wt 198.8 lb

## 2020-05-07 DIAGNOSIS — D1801 Hemangioma of skin and subcutaneous tissue: Secondary | ICD-10-CM

## 2020-05-07 DIAGNOSIS — L989 Disorder of the skin and subcutaneous tissue, unspecified: Secondary | ICD-10-CM | POA: Diagnosis not present

## 2020-05-07 NOTE — Progress Notes (Signed)
   Subjective:    Patient ID: Erika Yu, female    DOB: [birth year], 55 y.o.   MRN: [id number]  HPI She is here for evaluation of a lesion in her mid back area she cannot see.  It does feel slightly raised.   Review of Systems     Objective:   Physical Exam In 1.5 cm slightly raised minimally pigmented lesion with well-demarcated borders is noted in the mid back area. Other cherry angiomas were also noted.      Assessment & Plan:  Benign skin lesion  Cherry angioma I reassured her that none of these are of any major concern.  She was comfortable with that.

## 2020-11-06 NOTE — Telephone Encounter (Signed)
Annual exam is on schedule for December 2022.

## 2021-02-02 IMAGING — DX LEFT HUMERUS - 2+ VIEW
3 series · 3 of 3 positions shown · non-contrast
Comparison: None.

CLINICAL DATA: Deltoid pain after flu shot

EXAM:
LEFT HUMERUS - 2+ VIEW

[humerus ap (1 of 2)]
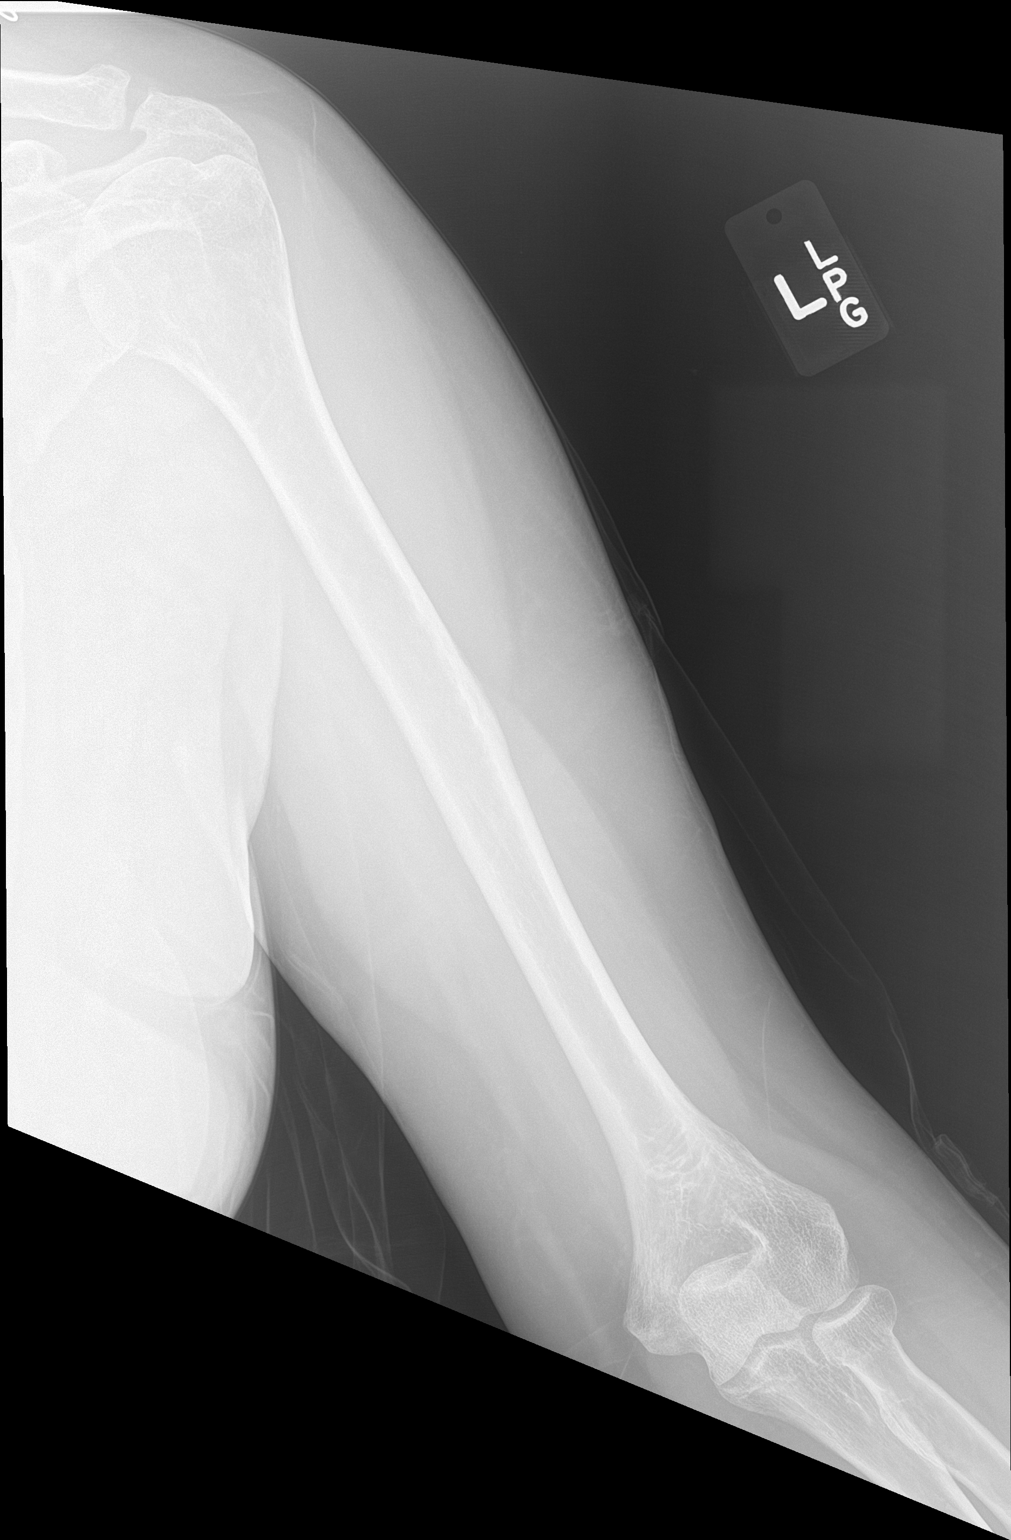

[humerus lat]
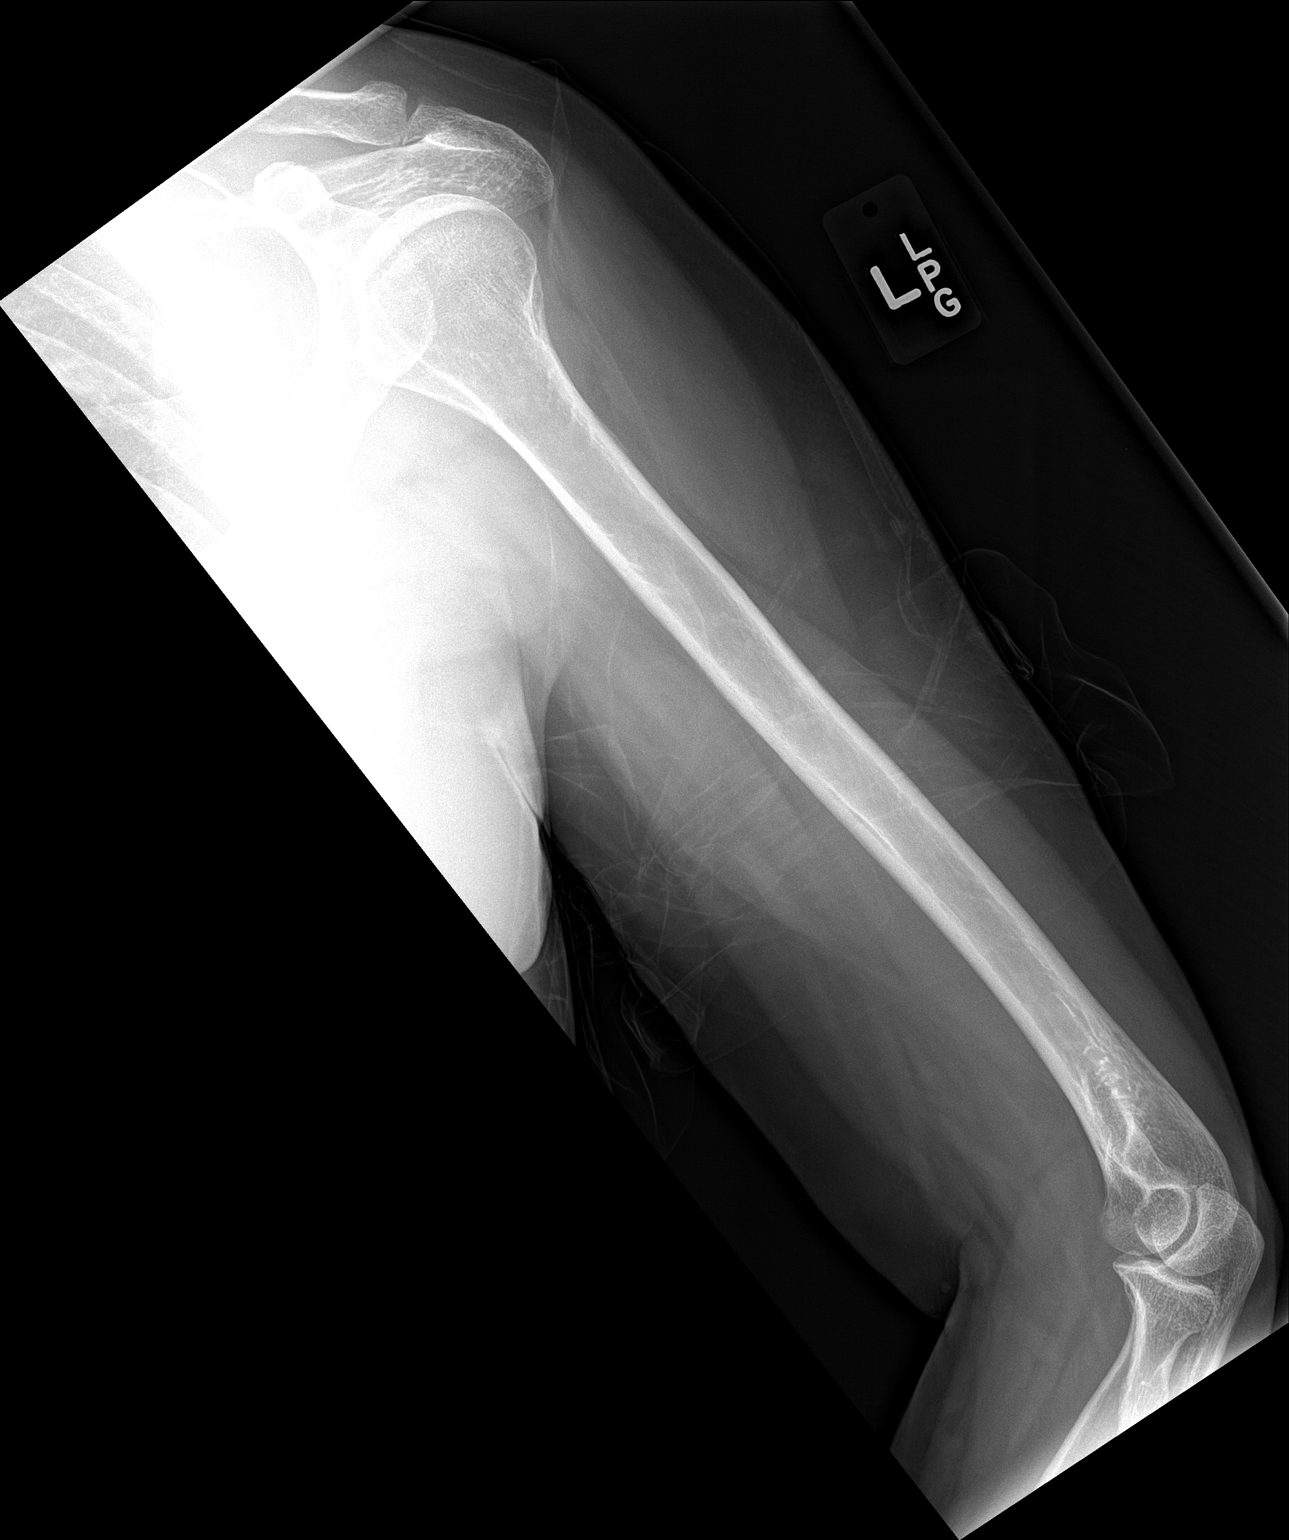

[humerus ap (2 of 2)]
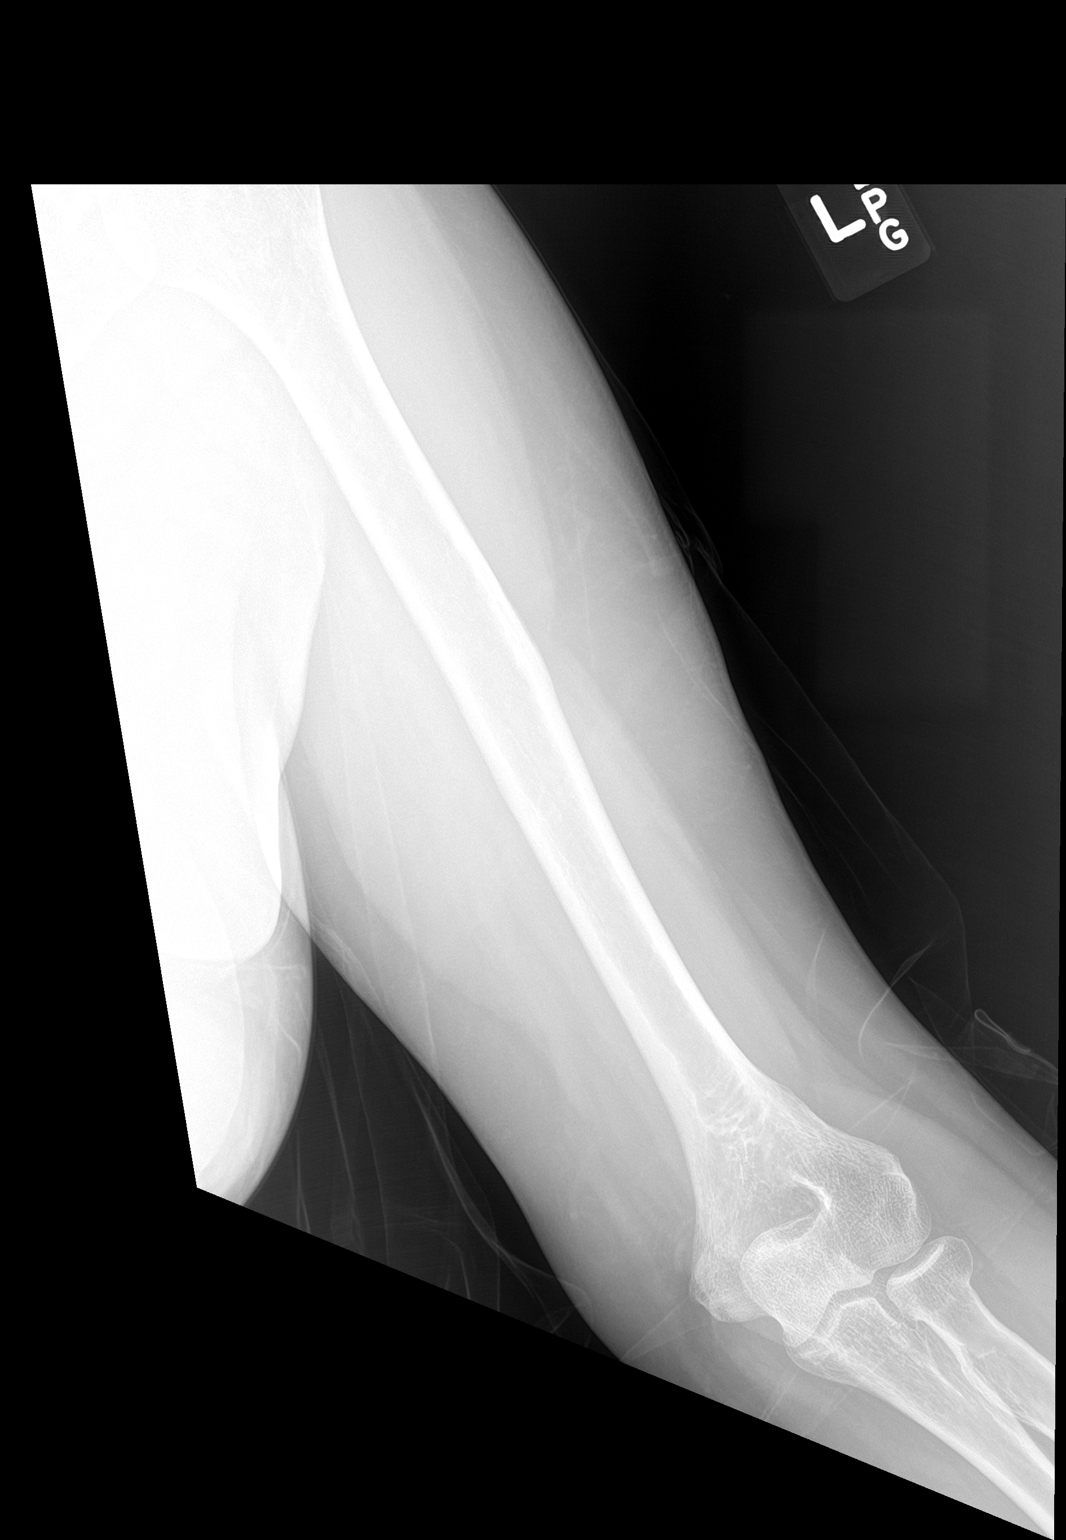

[3 of 3 positions shown; findings below may reference images not displayed]

FINDINGS: There is no evidence of fracture or other focal bone lesions. Soft
tissues are unremarkable.
IMPRESSION: Negative.

## 2021-03-26 ENCOUNTER — Ambulatory Visit: Payer: No Typology Code available for payment source | Admitting: Family Medicine

## 2021-03-26 ENCOUNTER — Encounter: Payer: Self-pay | Admitting: Family Medicine

## 2021-03-26 ENCOUNTER — Other Ambulatory Visit: Payer: Self-pay

## 2021-03-26 VITALS — BP 122/82 | HR 75 | Temp 97.0°F | Ht 67.0 in | Wt 201.6 lb

## 2021-03-26 DIAGNOSIS — Z23 Encounter for immunization: Secondary | ICD-10-CM

## 2021-03-26 DIAGNOSIS — Z1231 Encounter for screening mammogram for malignant neoplasm of breast: Secondary | ICD-10-CM

## 2021-03-26 DIAGNOSIS — Z0000 Encounter for general adult medical examination without abnormal findings: Secondary | ICD-10-CM

## 2021-03-26 DIAGNOSIS — Z1211 Encounter for screening for malignant neoplasm of colon: Secondary | ICD-10-CM | POA: Diagnosis not present

## 2021-03-26 DIAGNOSIS — Z1159 Encounter for screening for other viral diseases: Secondary | ICD-10-CM | POA: Diagnosis not present

## 2021-03-26 NOTE — Progress Notes (Signed)
° °  Subjective:    Patient ID: Ennis Forts, female    DOB: Feb 27, 1966, 55 y.o.   MRN: [id number]  HPI She is here for complete examination.  She does work on the pediatric floor at the hospital.  She plans to spend the winter in [place].  She has no particular concerns or complaints.  She does not smoke or drink.  Does keep her self physically active.  She does plan on getting Pap.  We will order the mammogram.  She is going to check on her Tdap since she does work at the hospital.  She has no other concerns or complaints.  Family and social history as well as health maintenance was reviewed.  Review of the record indicates the only problem she has has all been orthopedic in nature.   Review of Systems  All other systems reviewed and are negative.     Objective:   Physical Exam Alert and in no distress. Tympanic membranes and canals are normal. Pharyngeal area is normal. Neck is supple without adenopathy or thyromegaly. Cardiac exam shows a regular sinus rhythm without murmurs or gallops. Lungs are clear to auscultation.        Assessment & Plan:  Routine general medical examination at a health care facility - Plan: CBC with Differential/Platelet, Comprehensive metabolic panel, Lipid panel  Immunization, viral disease - Plan: Pfizer Covid-19 Vaccine Bivalent Booster  Need for hepatitis C screening test - Plan: Hepatitis C antibody  Screening for colon cancer - Plan: Cologuard  Encounter for screening mammogram for malignant neoplasm of breast - Plan: MM Digital Screening Encouraged her to continue to take excellent care of her self and make sure she gets Pap and pelvic done.

## 2021-03-27 LAB — COMPREHENSIVE METABOLIC PANEL
ALT: 15 IU/L (ref 0–32)
AST: 17 IU/L (ref 0–40)
Albumin/Globulin Ratio: 2 (ref 1.2–2.2)
Albumin: 4.7 g/dL (ref 3.8–4.9)
Alkaline Phosphatase: 106 IU/L (ref 44–121)
BUN/Creatinine Ratio: 18 (ref 9–23)
BUN: 17 mg/dL (ref 6–24)
Bilirubin Total: 0.6 mg/dL (ref 0.0–1.2)
CO2: 25 mmol/L (ref 20–29)
Calcium: 9.9 mg/dL (ref 8.7–10.2)
Chloride: 102 mmol/L (ref 96–106)
Creatinine, Ser: 0.95 mg/dL (ref 0.57–1.00)
Globulin, Total: 2.3 g/dL (ref 1.5–4.5)
Glucose: 91 mg/dL (ref 70–99)
Potassium: 4.6 mmol/L (ref 3.5–5.2)
Sodium: 142 mmol/L (ref 134–144)
Total Protein: 7 g/dL (ref 6.0–8.5)
eGFR: 71 mL/min/{1.73_m2} (ref >59)

## 2021-03-27 LAB — HEPATITIS C ANTIBODY: Hep C Virus Ab: <0.1 s/co ratio (ref 0.0–0.9)

## 2021-03-27 LAB — CBC WITH DIFFERENTIAL/PLATELET
Basophils Absolute: 0.1 10*3/uL (ref 0.0–0.2)
Basos: 1 %
EOS (ABSOLUTE): 0.2 10*3/uL (ref 0.0–0.4)
Eos: 2 %
Hematocrit: 35.3 % (ref 34.0–46.6)
Hemoglobin: 11.2 g/dL (ref 11.1–15.9)
Immature Grans (Abs): 0 10*3/uL (ref 0.0–0.1)
Immature Granulocytes: 1 %
Lymphocytes Absolute: 2 10*3/uL (ref 0.7–3.1)
Lymphs: 26 %
MCH: 19.5 pg — ABNORMAL LOW (ref 26.6–33.0)
MCHC: 31.7 g/dL (ref 31.5–35.7)
MCV: 62 fL — ABNORMAL LOW (ref 79–97)
Monocytes Absolute: 0.8 10*3/uL (ref 0.1–0.9)
Monocytes: 11 %
Neutrophils Absolute: 4.6 10*3/uL (ref 1.4–7.0)
Neutrophils: 59 %
Platelets: 465 10*3/uL — ABNORMAL HIGH (ref 150–450)
RBC: 5.73 x10E6/uL — ABNORMAL HIGH (ref 3.77–5.28)
RDW: 15 % (ref 11.7–15.4)
WBC: 7.6 10*3/uL (ref 3.4–10.8)

## 2021-03-27 LAB — LIPID PANEL
Chol/HDL Ratio: 2.8 ratio (ref 0.0–4.4)
Cholesterol, Total: 174 mg/dL (ref 100–199)
HDL: 62 mg/dL (ref >39)
LDL Chol Calc (NIH): 97 mg/dL (ref 0–99)
Triglycerides: 79 mg/dL (ref 0–149)
VLDL Cholesterol Cal: 15 mg/dL (ref 5–40)

## 2021-04-16 LAB — HM COLONOSCOPY

## 2021-04-18 LAB — HM PAP SMEAR

## 2021-04-18 LAB — RESULTS CONSOLE HPV: CHL HPV: NEGATIVE

## 2021-04-19 ENCOUNTER — Ambulatory Visit
Admission: RE | Admit: 2021-04-19 | Discharge: 2021-04-19 | Disposition: A | Discharge status: Home / Self Care | Payer: No Typology Code available for payment source | Source: Ambulatory Visit | Attending: Family Medicine | Admitting: Family Medicine

## 2021-04-19 LAB — HM MAMMOGRAPHY

## 2021-04-23 LAB — COLOGUARD: COLOGUARD: NEGATIVE

## 2021-06-12 ENCOUNTER — Telehealth: Payer: Self-pay | Admitting: Family Medicine

## 2021-06-12 NOTE — Telephone Encounter (Signed)
Called [facility] in reference to patient. Trying to get continuity of care info concerning most recent visit.  ?

## 2021-06-14 ENCOUNTER — Encounter: Payer: Self-pay | Admitting: Family Medicine

## 2021-06-14 DIAGNOSIS — D569 Thalassemia, unspecified: Secondary | ICD-10-CM

## 2021-10-10 ENCOUNTER — Other Ambulatory Visit (HOSPITAL_COMMUNITY): Payer: Self-pay

## 2021-12-04 ENCOUNTER — Encounter: Payer: Self-pay | Admitting: Internal Medicine

## 2022-01-20 ENCOUNTER — Encounter: Payer: Self-pay | Admitting: Internal Medicine

## 2022-02-25 ENCOUNTER — Encounter: Payer: No Typology Code available for payment source | Admitting: Nurse Practitioner

## 2022-04-07 ENCOUNTER — Encounter: Payer: No Typology Code available for payment source | Admitting: Family Medicine

## 2022-04-08 ENCOUNTER — Encounter: Payer: No Typology Code available for payment source | Admitting: Nurse Practitioner

## 2022-04-18 ENCOUNTER — Other Ambulatory Visit (HOSPITAL_COMMUNITY): Payer: Self-pay

## 2022-05-05 DIAGNOSIS — D2261 Melanocytic nevi of right upper limb, including shoulder: Secondary | ICD-10-CM | POA: Diagnosis not present

## 2022-05-05 DIAGNOSIS — D2372 Other benign neoplasm of skin of left lower limb, including hip: Secondary | ICD-10-CM | POA: Diagnosis not present

## 2022-05-05 DIAGNOSIS — D1801 Hemangioma of skin and subcutaneous tissue: Secondary | ICD-10-CM | POA: Diagnosis not present

## 2022-05-05 DIAGNOSIS — D224 Melanocytic nevi of scalp and neck: Secondary | ICD-10-CM | POA: Diagnosis not present

## 2022-05-05 DIAGNOSIS — D2272 Melanocytic nevi of left lower limb, including hip: Secondary | ICD-10-CM | POA: Diagnosis not present

## 2022-05-05 DIAGNOSIS — D225 Melanocytic nevi of trunk: Secondary | ICD-10-CM | POA: Diagnosis not present

## 2022-05-05 DIAGNOSIS — L738 Other specified follicular disorders: Secondary | ICD-10-CM | POA: Diagnosis not present

## 2022-05-05 DIAGNOSIS — D2262 Melanocytic nevi of left upper limb, including shoulder: Secondary | ICD-10-CM | POA: Diagnosis not present

## 2022-06-16 ENCOUNTER — Other Ambulatory Visit (INDEPENDENT_AMBULATORY_CARE_PROVIDER_SITE_OTHER): Payer: 59

## 2022-06-16 ENCOUNTER — Other Ambulatory Visit (HOSPITAL_BASED_OUTPATIENT_CLINIC_OR_DEPARTMENT_OTHER): Payer: Self-pay

## 2022-06-16 ENCOUNTER — Ambulatory Visit (INDEPENDENT_AMBULATORY_CARE_PROVIDER_SITE_OTHER): Payer: 59 | Admitting: Orthopaedic Surgery

## 2022-06-16 ENCOUNTER — Encounter: Payer: Self-pay | Admitting: Orthopaedic Surgery

## 2022-06-16 ENCOUNTER — Other Ambulatory Visit (HOSPITAL_COMMUNITY): Payer: Self-pay

## 2022-06-16 DIAGNOSIS — G8929 Other chronic pain: Secondary | ICD-10-CM

## 2022-06-16 DIAGNOSIS — M25562 Pain in left knee: Secondary | ICD-10-CM

## 2022-06-16 MED ORDER — CELECOXIB 200 MG PO CAPS
200.0000 mg | ORAL_CAPSULE | Freq: Two times a day (BID) | ORAL | 1 refills | Status: DC | PRN
  Filled 2022-06-16: qty 60, 30d supply, fill #0
  Filled 2022-09-17: qty 60, 30d supply, fill #1

## 2022-06-16 MED ORDER — CELECOXIB 200 MG PO CAPS
200.0000 mg | ORAL_CAPSULE | Freq: Two times a day (BID) | ORAL | 1 refills | Status: DC | PRN
  Filled 2022-06-16: qty 60, 30d supply, fill #0

## 2022-06-16 MED ORDER — METHYLPREDNISOLONE ACETATE 40 MG/ML IJ SUSP
40.0000 mg | INTRAMUSCULAR | Status: AC | PRN
  Administered 2022-06-16: 40 mg via INTRA_ARTICULAR

## 2022-06-16 MED ORDER — LIDOCAINE HCL 1 % IJ SOLN
3.0000 mL | INTRAMUSCULAR | Status: AC | PRN
  Administered 2022-06-16: 3 mL

## 2022-06-16 NOTE — Progress Notes (Signed)
The patient comes in today with left knee pain.  She says the knee does pop and crack quite a bit and it hurts along the medial joint line.  There is been some locking and catching and swelling off and on.  She does take Motrin daily and has tried anti-inflammatory cream as well as bracing and taping.  This has been slowly getting worse for her.  She is getting ready to go on a big trip.  Examination of her left knee today shows no effusion.  There is pain along the medial joint line and a positive McMurray's exam to the medial joint line.  There is no effusion today and the range of motion is full.  Her knee feels ligamentously stable but there is patellofemoral crepitation.  2 views of the left knee show normal joint space in the medial and lateral compartments.  The alignment is neutral.  There is some patellofemoral narrowing.  Given the fact that she is going on a trip soon, I recommended a steroid injection in her left knee today to ease the pain down from her knee.  If she continues to have mechanical symptoms then a MRI would be warranted.  She will work on quad strengthening exercises in the interim as well.  I did place a steroid injection in her left knee today without difficulty.  If things worsen she will let us know.       Procedure Note  Patient: Erika Yu             Date of Birth: 1966/03/19           MRN: [id number]             Visit Date: 06/16/2022  Procedures: Visit Diagnoses:  1. Chronic pain of left knee     Large Joint Inj: L knee on 06/16/2022 8:57 AM Indications: diagnostic evaluation and pain Details: 22 G 1.5 in needle, superolateral approach  Arthrogram: No  Medications: 3 mL lidocaine 1 %; 40 mg methylPREDNISolone acetate 40 MG/ML Outcome: tolerated well, no immediate complications Procedure, treatment alternatives, risks and benefits explained, specific risks discussed. Consent was given by the patient. Immediately prior to procedure a time out  was called to verify the correct patient, procedure, equipment, support staff and site/side marked as required. Patient was prepped and draped in the usual sterile fashion.

## 2022-09-08 ENCOUNTER — Other Ambulatory Visit: Payer: Self-pay | Admitting: Family Medicine

## 2022-09-08 DIAGNOSIS — Z1231 Encounter for screening mammogram for malignant neoplasm of breast: Secondary | ICD-10-CM

## 2022-09-22 ENCOUNTER — Other Ambulatory Visit (HOSPITAL_COMMUNITY): Payer: Self-pay

## 2022-10-20 ENCOUNTER — Other Ambulatory Visit: Payer: Self-pay | Admitting: Oncology

## 2022-10-20 DIAGNOSIS — Z006 Encounter for examination for normal comparison and control in clinical research program: Secondary | ICD-10-CM

## 2022-10-31 ENCOUNTER — Other Ambulatory Visit: Payer: Self-pay | Admitting: Orthopaedic Surgery

## 2022-10-31 ENCOUNTER — Encounter: Payer: Self-pay | Admitting: Nurse Practitioner

## 2022-10-31 ENCOUNTER — Other Ambulatory Visit (HOSPITAL_COMMUNITY): Payer: Self-pay

## 2022-10-31 DIAGNOSIS — Z0000 Encounter for general adult medical examination without abnormal findings: Secondary | ICD-10-CM

## 2022-10-31 MED ORDER — CELECOXIB 200 MG PO CAPS
200.0000 mg | ORAL_CAPSULE | Freq: Two times a day (BID) | ORAL | 1 refills | Status: DC | PRN
  Filled 2022-10-31: qty 60, 30d supply, fill #0
  Filled 2023-03-01: qty 60, 30d supply, fill #1

## 2022-11-03 ENCOUNTER — Other Ambulatory Visit (HOSPITAL_COMMUNITY): Payer: Self-pay

## 2022-11-04 DIAGNOSIS — Z01419 Encounter for gynecological examination (general) (routine) without abnormal findings: Secondary | ICD-10-CM | POA: Diagnosis not present

## 2022-11-05 ENCOUNTER — Ambulatory Visit
Admission: RE | Admit: 2022-11-05 | Discharge: 2022-11-05 | Disposition: A | Discharge status: Home / Self Care | Payer: 59 | Source: Ambulatory Visit | Attending: Family Medicine | Admitting: Family Medicine

## 2022-11-05 DIAGNOSIS — Z1231 Encounter for screening mammogram for malignant neoplasm of breast: Secondary | ICD-10-CM | POA: Diagnosis not present

## 2022-11-13 ENCOUNTER — Encounter: Payer: Self-pay | Admitting: Nurse Practitioner

## 2022-11-13 ENCOUNTER — Ambulatory Visit: Payer: 59 | Admitting: Nurse Practitioner

## 2022-11-13 ENCOUNTER — Other Ambulatory Visit (HOSPITAL_COMMUNITY): Payer: Self-pay

## 2022-11-13 VITALS — BP 128/80 | HR 74 | Ht 68.0 in | Wt 208.0 lb

## 2022-11-13 DIAGNOSIS — Z0000 Encounter for general adult medical examination without abnormal findings: Secondary | ICD-10-CM | POA: Insufficient documentation

## 2022-11-13 DIAGNOSIS — R635 Abnormal weight gain: Secondary | ICD-10-CM

## 2022-11-13 DIAGNOSIS — R7989 Other specified abnormal findings of blood chemistry: Secondary | ICD-10-CM

## 2022-11-13 DIAGNOSIS — L309 Dermatitis, unspecified: Secondary | ICD-10-CM | POA: Diagnosis not present

## 2022-11-13 MED ORDER — TRIAMCINOLONE ACETONIDE 0.1 % EX CREA
1.0000 | TOPICAL_CREAM | Freq: Two times a day (BID) | CUTANEOUS | 3 refills | Status: AC
Start: 2022-11-13 — End: ?
  Filled 2022-11-13: qty 30, 15d supply, fill #0

## 2022-11-13 NOTE — Progress Notes (Signed)
Shawna Clamp, DNP, AGNP-c [facility] [address] [place], [place] [address] Main Office [phone]  BP 128/80   Pulse 74   Ht 5\' 8"  (1.727 m)   Wt 208 lb (94.3 kg)   BMI 31.63 kg/m    Subjective:    Patient ID: Erika Yu, female    DOB: Oct 21, 1965, 57 y.o.   MRN: [id number]  HPI: Erika Yu is a 57 y.o. female presenting on 11/13/2022 for comprehensive medical examination.   Current medical concerns include: Weight gain Eczema  A comprehensive review of systems was negative.  IMMUNIZATIONS:   Flu: Flu vaccine postponed until flu season Prevnar 13: Prevnar 13 N/A for this patient Prevnar 20: Prevnar 20 N/A for this patient Pneumovax 23: Pneumovax 23 N/A for this patient Vac Shingrix: Shingrix completed, documentation in chart (Dose # 2/2) HPV: HPV N/A for this patient Tetanus: Tetanus completed in the last 10 years COVID: COVID completed, documentation in chart   HEALTH MAINTENANCE: Pap Smear HM Status: is up to date Mammogram HM Status: is up to date Colon Cancer Screening HM Status: is up to date Bone Density HM Status: is not applicable for this patient STI Testing HM Status: is up to date Lung CT HM Status: is not applicable for this patient  She denies concerns with vision, hearing, or dentition.   Most Recent Depression Screen:     11/13/2022   11:40 AM 03/26/2021    2:31 PM 09/16/2018   11:16 AM 01/18/2018   10:17 AM  Depression screen PHQ 2/9  Decreased Interest 0 0 0 0  Down, Depressed, Hopeless 0 0 0 0  PHQ - 2 Score 0 0 0 0   Most Recent Anxiety Screen:      No data to display         Most Recent Fall Screen:    11/13/2022   11:40 AM  Fall Risk   Falls in the past year? 0  Number falls in past yr: 0  Injury with Fall? 0  Risk for fall due to : No Fall Risks  Follow up Falls evaluation completed    Past medical history, surgical history, medications, allergies, family history and social history  reviewed with patient today and changes made to appropriate areas of the chart.  Past Medical History:  History reviewed. No pertinent past medical history. Medications:  Current Outpatient Medications on File Prior to Visit  Medication Sig   Ascorbic Acid (VITAMIN C) 1000 MG tablet Take 1,000 mg by mouth daily.   aspirin EC 81 MG tablet Take 81 mg by mouth daily.   Bacillus Coagulans-Inulin (PROBIOTIC) 1-250 BILLION-MG CAPS Take by mouth.   Calcium Carb-Cholecalciferol (CALCIUM-VITAMIN D) 500-200 MG-UNIT tablet Take 1 tablet by mouth daily.   celecoxib (CELEBREX) 200 MG capsule Take 1 capsule (200 mg total) by mouth 2 (two) times daily between meals as needed.   Collagen Hydrolysate POWD    Crisaborole (EUCRISA) 2 % OINT Apply 1 application topically 2 (two) times daily.   cyclobenzaprine (FLEXERIL) 10 MG tablet TAKE 1 TABLET BY MOUTH TWICE A DAY AS NEEDED FOR MUSCLE SPASMS   glucosamine-chondroitin 500-400 MG tablet Take 1 tablet by mouth 3 (three) times daily.   loratadine (CLARITIN) 10 MG tablet Take 10 mg by mouth daily.   Misc Natural Products (LUTEIN 20 PO) Take 20 mg by mouth daily.   Multiple Vitamins-Minerals (MULTIVITAMIN WITH MINERALS) tablet Take 1 tablet by mouth daily.   TURMERIC PO    No  current facility-administered medications on file prior to visit.   Surgical History:  History reviewed. No pertinent surgical history. Allergies:  Allergies  Allergen Reactions   Citric Acid Other (See Comments)    Causes Eczema breakout    Family History:  Family History  Problem Relation Age of Onset   Breast cancer Neg Hx        Objective:    BP 128/80   Pulse 74   Ht 5\' 8"  (1.727 m)   Wt 208 lb (94.3 kg)   BMI 31.63 kg/m   Wt Readings from Last 3 Encounters:  11/13/22 208 lb (94.3 kg)  03/26/21 201 lb 9.6 oz (91.4 kg)  05/07/20 198 lb 12.8 oz (90.2 kg)    Physical Exam Vitals and nursing note reviewed.  Constitutional:      General: She is not in acute  distress.    Appearance: Normal appearance.  HENT:     Head: Normocephalic and atraumatic.     Right Ear: Hearing, tympanic membrane, ear canal and external ear normal.     Left Ear: Hearing, tympanic membrane, ear canal and external ear normal.     Nose: Nose normal.     Right Sinus: No maxillary sinus tenderness or frontal sinus tenderness.     Left Sinus: No maxillary sinus tenderness or frontal sinus tenderness.     Mouth/Throat:     Lips: Pink.     Mouth: Mucous membranes are moist.     Pharynx: Oropharynx is clear.  Eyes:     General: Lids are normal. Vision grossly intact.     Extraocular Movements: Extraocular movements intact.     Conjunctiva/sclera: Conjunctivae normal.     Pupils: Pupils are equal, round, and reactive to light.     Funduscopic exam:    Right eye: Red reflex present.        Left eye: Red reflex present.    Visual Fields: Right eye visual fields normal and left eye visual fields normal.  Neck:     Thyroid: No thyromegaly.     Vascular: No carotid bruit.  Cardiovascular:     Rate and Rhythm: Normal rate and regular rhythm.     Chest Wall: PMI is not displaced.     Pulses: Normal pulses.          Dorsalis pedis pulses are 2+ on the right side and 2+ on the left side.       Posterior tibial pulses are 2+ on the right side and 2+ on the left side.     Heart sounds: Normal heart sounds. No murmur heard. Pulmonary:     Effort: Pulmonary effort is normal. No respiratory distress.     Breath sounds: Normal breath sounds.  Abdominal:     General: Abdomen is flat. Bowel sounds are normal. There is no distension.     Palpations: Abdomen is soft. There is no hepatomegaly, splenomegaly or mass.     Tenderness: There is no abdominal tenderness. There is no right CVA tenderness, left CVA tenderness, guarding or rebound.  Musculoskeletal:        General: Normal range of motion.     Cervical back: Full passive range of motion without pain, normal range of motion and  neck supple. No tenderness.     Right lower leg: No edema.     Left lower leg: No edema.  Feet:     Left foot:     Toenail Condition: Left toenails are normal.  Lymphadenopathy:  Cervical: No cervical adenopathy.     Upper Body:     Right upper body: No supraclavicular adenopathy.     Left upper body: No supraclavicular adenopathy.  Skin:    General: Skin is warm and dry.     Capillary Refill: Capillary refill takes less than 2 seconds.     Nails: There is no clubbing.  Neurological:     General: No focal deficit present.     Mental Status: She is alert and oriented to person, place, and time.     GCS: GCS eye subscore is 4. GCS verbal subscore is 5. GCS motor subscore is 6.     Sensory: Sensation is intact.     Motor: Motor function is intact.     Coordination: Coordination is intact.     Gait: Gait is intact.     Deep Tendon Reflexes: Reflexes are normal and symmetric.  Psychiatric:        Attention and Perception: Attention normal.        Mood and Affect: Mood normal.        Speech: Speech normal.        Behavior: Behavior normal. Behavior is cooperative.        Thought Content: Thought content normal.        Cognition and Memory: Cognition and memory normal.        Judgment: Judgment normal.     Results for orders placed or performed in visit on 11/13/22  CBC with Differential/Platelet  Result Value Ref Range   WBC 7.5 3.4 - 10.8 x10E3/uL   RBC 5.99 (H) 3.77 - 5.28 x10E6/uL   Hemoglobin 11.6 11.1 - 15.9 g/dL   Hematocrit 16.1 09.6 - 46.6 %   MCV 65 (L) 79 - 97 fL   MCH 19.4 (L) 26.6 - 33.0 pg   MCHC 29.7 (L) 31.5 - 35.7 g/dL   RDW 04.5 (H) 40.9 - 81.1 %   Platelets 495 (H) 150 - 450 x10E3/uL   Neutrophils 66 Not Estab. %   Lymphs 22 Not Estab. %   Monocytes 7 Not Estab. %   Eos 3 Not Estab. %   Basos 1 Not Estab. %   Neutrophils Absolute 5.0 1.4 - 7.0 x10E3/uL   Lymphocytes Absolute 1.7 0.7 - 3.1 x10E3/uL   Monocytes Absolute 0.5 0.1 - 0.9 x10E3/uL   EOS  (ABSOLUTE) 0.3 0.0 - 0.4 x10E3/uL   Basophils Absolute 0.1 0.0 - 0.2 x10E3/uL   Immature Granulocytes 1 Not Estab. %   Immature Grans (Abs) 0.1 0.0 - 0.1 x10E3/uL  CMP14+EGFR  Result Value Ref Range   Glucose 88 70 - 99 mg/dL   BUN 17 6 - 24 mg/dL   Creatinine, Ser 9.14 (H) 0.57 - 1.00 mg/dL   eGFR 64 >78 GN/FAO/1.30   BUN/Creatinine Ratio 17 9 - 23   Sodium 140 134 - 144 mmol/L   Potassium 4.8 3.5 - 5.2 mmol/L   Chloride 102 96 - 106 mmol/L   CO2 21 20 - 29 mmol/L   Calcium 10.1 8.7 - 10.2 mg/dL   Total Protein 7.0 6.0 - 8.5 g/dL   Albumin 4.6 3.8 - 4.9 g/dL   Globulin, Total 2.4 1.5 - 4.5 g/dL   Bilirubin Total 0.5 0.0 - 1.2 mg/dL   Alkaline Phosphatase 97 44 - 121 IU/L   AST 25 0 - 40 IU/L   ALT 24 0 - 32 IU/L  Hemoglobin A1c  Result Value Ref Range   Hgb A1c MFr  Bld 5.4 4.8 - 5.6 %   Est. average glucose Bld gHb Est-mCnc 108 mg/dL  Lipid panel  Result Value Ref Range   Cholesterol, Total 178 100 - 199 mg/dL   Triglycerides 97 0 - 149 mg/dL   HDL 60 >16 mg/dL   VLDL Cholesterol Cal 18 5 - 40 mg/dL   LDL Chol Calc (NIH) 109 (H) 0 - 99 mg/dL   Chol/HDL Ratio 3.0 0.0 - 4.4 ratio  TSH  Result Value Ref Range   TSH 1.520 0.450 - 4.500 uIU/mL  Insulin, Free and Total  Result Value Ref Range   Free Insulin 15 uU/mL   Total Insulin 15 uU/mL         Assessment & Plan:   Problem List Items Addressed This Visit     Eczema    Refill on triamcinolone. No concerning symptoms.       Relevant Medications   triamcinolone cream (KENALOG) 0.1 %   Other Relevant Orders   CBC with Differential/Platelet (Completed)   Weight increase    Labs pending. Diet and exercise recommendations provided. Will make changes as needed based on results.       Relevant Orders   CBC with Differential/Platelet (Completed)   Hemoglobin A1c (Completed)   TSH (Completed)   Insulin, Free and Total (Completed)   Routine general medical examination at a health care facility - Primary    CPE  completed today. Review of HM activities and recommendations discussed and provided on AVS. Anticipatory guidance, diet, and exercise recommendations provided. Medications, allergies, and hx reviewed and updated as necessary. Orders placed as listed below.  Plan: - Labs ordered. Will make changes as necessary based on results.  - I will review these results and send recommendations via MyChart or a telephone call.  - F/U with CPE in 1 year or sooner for acute/chronic health needs as directed.        Relevant Orders   CBC with Differential/Platelet (Completed)   CMP14+EGFR (Completed)   Lipid panel (Completed)       Follow up plan: Return in about 1 year (around 11/13/2023) for CPE.  NEXT PREVENTATIVE PHYSICAL DUE IN 1 YEAR.  PATIENT COUNSELING PROVIDED FOR ALL ADULT PATIENTS: A well balanced diet low in saturated fats, cholesterol, and moderation in carbohydrates.  This can be as simple as monitoring portion sizes and cutting back on sugary beverages such as soda and juice to start with.    Daily water consumption of at least 64 ounces.  Physical activity at least 180 minutes per week.  If just starting out, start 10 minutes a day and work your way up.   This can be as simple as taking the stairs instead of the elevator and walking 2-3 laps around the office  purposefully every day.   STD protection, partner selection, and regular testing if high risk.  Limited consumption of alcoholic beverages if alcohol is consumed. For men, I recommend no more than 14 alcoholic beverages per week, spread out throughout the week (max 2 per day). Avoid "binge" drinking or consuming large quantities of alcohol in one setting.  Please let me know if you feel you may need help with reduction or quitting alcohol consumption.   Avoidance of nicotine, if used. Please let me know if you feel you may need help with reduction or quitting nicotine use.   Daily mental health attention. This can be in  the form of 5 minute daily meditation, prayer, journaling, yoga, reflection, etc.  Purposeful attention to your emotions and mental state can significantly improve your overall wellbeing  and  Health.  Please know that I am here to help you with all of your health care goals and am happy to work with you to find a solution that works best for you.  The greatest advice I have received with any changes in life are to take it one step at a time, that even means if all you can focus on is the next 60 seconds, then do that and celebrate your victories.  With any changes in life, you will have set backs, and that is OK. The important thing to remember is, if you have a set back, it is not a failure, it is an opportunity to try again! Screening Testing Mammogram Every 1 -2 years based on history and risk factors Starting at age 74 Pap Smear Ages 21-39 every 3 years Ages 54-65 every 5 years with HPV testing More frequent testing may be required based on results and history Colon Cancer Screening Every 1-10 years based on test performed, risk factors, and history Starting at age 62 Bone Density Screening Every 2-10 years based on history Starting at age 42 for women Recommendations for men differ based on medication usage, history, and risk factors AAA Screening One time ultrasound Men 40-104 years old who have every smoked Lung Cancer Screening Low Dose Lung CT every 12 months Age 21-80 years with a 30 pack-year smoking history who still smoke or who have quit within the last 15 years   Screening Labs Routine  Labs: Complete Blood Count (CBC), Complete Metabolic Panel (CMP), Cholesterol (Lipid Panel) Every 6-12 months based on history and medications May be recommended more frequently based on current conditions or previous results Hemoglobin A1c Lab Every 3-12 months based on history and previous results Starting at age 60 or earlier with diagnosis of diabetes, high cholesterol, BMI >26,  and/or risk factors Frequent monitoring for patients with diabetes to ensure blood sugar control Thyroid Panel (TSH) Every 6 months based on history, symptoms, and risk factors May be repeated more often if on medication HIV One time testing for all patients 56 and older May be repeated more frequently for patients with increased risk factors or exposure Hepatitis C One time testing for all patients 28 and older May be repeated more frequently for patients with increased risk factors or exposure Gonorrhea, Chlamydia Every 12 months for all sexually active persons 13-24 years Additional monitoring may be recommended for those who are considered high risk or who have symptoms Every 12 months for any woman on birth control, regardless of sexual activity PSA Men 55-23 years old with risk factors Additional screening may be recommended from age 81-69 based on risk factors, symptoms, and history  Vaccine Recommendations Tetanus Booster All adults every 10 years Flu Vaccine All patients 6 months and older every year COVID Vaccine All patients 12 years and older Initial dosing with booster May recommend additional booster based on age and health history HPV Vaccine 2 doses all patients age 60-26 Dosing may be considered for patients over 26 Shingles Vaccine (Shingrix) 2 doses all adults 55 years and older Pneumonia (Pneumovax 92) All adults 65 years and older May recommend earlier dosing based on health history One year apart from Prevnar 32 Pneumonia (Prevnar 93) All adults 65 years and older Dosed 1 year after Pneumovax 23 Pneumonia (Prevnar 20) One time alternative to the two dosing of 13 and 23 For all adults with  initial dose of 23, 20 is recommended 1 year later For all adults with initial dose of 13, 23 is still recommended as second option 1 year later

## 2022-11-13 NOTE — Assessment & Plan Note (Signed)

## 2022-11-13 NOTE — Patient Instructions (Addendum)
It was a pleasure to meet you today! I will let you know if there are any concerns with your labs.    WEIGHT LOSS PLANNING  For best management of weight, it is vital to balance intake versus output. This means the number of calories burned per day must be less than the calories you take in with food and drink.   I recommend trying to follow a diet with the following: Calories: 1200-1500 calories per day Carbohydrates: 150-180 grams of carbohydrates per day  Why: Gives your body enough "quick fuel" for cells to maintain normal function without sending them into starvation mode.  Protein: At least 90 grams of protein per day- 30 grams with each meal Why: Protein takes longer and uses more energy than carbohydrates to break down for fuel. The carbohydrates in your meals serves as quick energy sources and proteins help use some of that extra quick energy to break down to produce long term energy. This helps you not feel hungry as quickly and protein breakdown burns calories.  Water: Drink AT LEAST 64 ounces of water per day  Why: Water is essential to healthy metabolism. Water helps to fill the stomach and keep you fuller longer. Water is required for healthy digestion and filtering of waste in the body.  Fat: Limit fats in your diet- when choosing fats, choose foods with lower fats content such as lean meats (chicken, fish, [place]).  Why: Increased fat intake leads to storage "for later". Once you burn your carbohydrate energy, your body goes into fat and protein breakdown mode to help you loose weight.  Cholesterol: Fats and oils that are LIQUID at room temperature are best. Choose vegetable oils (olive oil, avocado oil, nuts). Avoid fats that are SOLID at room temperature (animal fats, processed meats). Healthy fats are often found in whole grains, beans, nuts, seeds, and berries.  Why: Elevated cholesterol levels lead to build up of cholesterol on the inside of your blood vessels. This will  eventually cause the blood vessels to become hard and can lead to high blood pressure and damage to your organs. When the blood flow is reduced, but the pressure is high from cholesterol buildup, parts of the cholesterol can break off and form clots that can go to the brain or heart leading to a stroke or heart attack.  Fiber: Increase amount of SOLUBLE the fiber in your diet. This helps to fill you up, lowers cholesterol, and helps with digestion. Some foods high in soluble fiber are oats, peas, beans, apples, carrots, barley, and citrus fruits.   Why: Fiber fills you up, helps remove excess cholesterol, and aids in healthy digestion which are all very important in weight management.   I recommend the following as a minimum activity routine: Purposeful walk or other physical activity at least 20 minutes every single day. This means purposefully taking a walk, jog, bike, swim, treadmill, elliptical, dance, etc.  This activity should be ABOVE your normal daily activities, such as walking at work. Goal exercise should be at least 150 minutes a week- work your way up to this.   Heart Rate: Your maximum exercise heart rate should be 220 - Your Age in Years. When exercising, get your heart rate up, but avoid going over the maximum targeted heart rate.  60-70% of your maximum heart rate is where you tend to burn the most fat. To find this number:  220 - Age In Years= Max HR  Max HR x 0.6 (or 0.7) =  Fat Burning HR The Fat Burning HR is your goal heart rate while working out to burn the most fat.  NEVER exercise to the point your feel lightheaded, weak, nauseated, dizzy. If you experience ANY of these symptoms- STOP exercise! Allow yourself to cool down and your heart rate to come down. Then restart slower next time.  If at ANY TIME you feel chest pain or chest pressure during exercise, STOP IMMEDIATELY and seek medical attention.

## 2022-11-21 LAB — CBC WITH DIFFERENTIAL/PLATELET
Basophils Absolute: 0.1 10*3/uL (ref 0.0–0.2)
Basos: 1 %
EOS (ABSOLUTE): 0.3 10*3/uL (ref 0.0–0.4)
Eos: 3 %
Hematocrit: 39 % (ref 34.0–46.6)
Hemoglobin: 11.6 g/dL (ref 11.1–15.9)
Immature Grans (Abs): 0.1 10*3/uL (ref 0.0–0.1)
Immature Granulocytes: 1 %
Lymphocytes Absolute: 1.7 10*3/uL (ref 0.7–3.1)
Lymphs: 22 %
MCH: 19.4 pg — ABNORMAL LOW (ref 26.6–33.0)
MCHC: 29.7 g/dL — ABNORMAL LOW (ref 31.5–35.7)
MCV: 65 fL — ABNORMAL LOW (ref 79–97)
Monocytes Absolute: 0.5 10*3/uL (ref 0.1–0.9)
Monocytes: 7 %
Neutrophils Absolute: 5 10*3/uL (ref 1.4–7.0)
Neutrophils: 66 %
Platelets: 495 10*3/uL — ABNORMAL HIGH (ref 150–450)
RBC: 5.99 x10E6/uL — ABNORMAL HIGH (ref 3.77–5.28)
RDW: 17.3 % — ABNORMAL HIGH (ref 11.7–15.4)
WBC: 7.5 10*3/uL (ref 3.4–10.8)

## 2022-11-21 LAB — HEMOGLOBIN A1C
Est. average glucose Bld gHb Est-mCnc: 108 mg/dL
Hgb A1c MFr Bld: 5.4 % (ref 4.8–5.6)

## 2022-11-21 LAB — CMP14+EGFR
ALT: 24 IU/L (ref 0–32)
AST: 25 IU/L (ref 0–40)
Albumin: 4.6 g/dL (ref 3.8–4.9)
Alkaline Phosphatase: 97 IU/L (ref 44–121)
BUN/Creatinine Ratio: 17 (ref 9–23)
BUN: 17 mg/dL (ref 6–24)
Bilirubin Total: 0.5 mg/dL (ref 0.0–1.2)
CO2: 21 mmol/L (ref 20–29)
Calcium: 10.1 mg/dL (ref 8.7–10.2)
Chloride: 102 mmol/L (ref 96–106)
Creatinine, Ser: 1.02 mg/dL — ABNORMAL HIGH (ref 0.57–1.00)
Globulin, Total: 2.4 g/dL (ref 1.5–4.5)
Glucose: 88 mg/dL (ref 70–99)
Potassium: 4.8 mmol/L (ref 3.5–5.2)
Sodium: 140 mmol/L (ref 134–144)
Total Protein: 7 g/dL (ref 6.0–8.5)
eGFR: 64 mL/min/{1.73_m2} (ref >59)

## 2022-11-21 LAB — LIPID PANEL
Chol/HDL Ratio: 3 ratio (ref 0.0–4.4)
Cholesterol, Total: 178 mg/dL (ref 100–199)
HDL: 60 mg/dL (ref >39)
LDL Chol Calc (NIH): 100 mg/dL — ABNORMAL HIGH (ref 0–99)
Triglycerides: 97 mg/dL (ref 0–149)
VLDL Cholesterol Cal: 18 mg/dL (ref 5–40)

## 2022-11-21 LAB — INSULIN, FREE AND TOTAL
Free Insulin: 15 uU/mL
Total Insulin: 15 uU/mL

## 2022-11-21 LAB — TSH: TSH: 1.52 u[IU]/mL (ref 0.450–4.500)

## 2022-11-25 DIAGNOSIS — R635 Abnormal weight gain: Secondary | ICD-10-CM | POA: Insufficient documentation

## 2022-11-25 DIAGNOSIS — L309 Dermatitis, unspecified: Secondary | ICD-10-CM | POA: Insufficient documentation

## 2022-11-25 NOTE — Assessment & Plan Note (Signed)
Labs pending. Diet and exercise recommendations provided. Will make changes as needed based on results.

## 2022-11-25 NOTE — Assessment & Plan Note (Signed)
Refill on triamcinolone. No concerning symptoms.

## 2022-11-28 ENCOUNTER — Telehealth: Payer: Self-pay | Admitting: Nurse Practitioner

## 2022-11-28 NOTE — Telephone Encounter (Signed)
Read over lab results from Erika Yu to patient and scheduled repeat CBC in 4 weeks

## 2022-12-16 ENCOUNTER — Other Ambulatory Visit: Payer: 59

## 2022-12-16 DIAGNOSIS — R7989 Other specified abnormal findings of blood chemistry: Secondary | ICD-10-CM

## 2022-12-17 LAB — CBC WITH DIFFERENTIAL/PLATELET
Basophils Absolute: 0.1 10*3/uL (ref 0.0–0.2)
Basos: 1 %
EOS (ABSOLUTE): 0.3 10*3/uL (ref 0.0–0.4)
Eos: 4 %
Hematocrit: 32 % — ABNORMAL LOW (ref 34.0–46.6)
Hemoglobin: 9.7 g/dL — ABNORMAL LOW (ref 11.1–15.9)
Immature Grans (Abs): 0.1 10*3/uL (ref 0.0–0.1)
Immature Granulocytes: 1 %
Lymphocytes Absolute: 2.5 10*3/uL (ref 0.7–3.1)
Lymphs: 30 %
MCH: 19.7 pg — ABNORMAL LOW (ref 26.6–33.0)
MCHC: 30.3 g/dL — ABNORMAL LOW (ref 31.5–35.7)
MCV: 65 fL — ABNORMAL LOW (ref 79–97)
Monocytes Absolute: 0.8 10*3/uL (ref 0.1–0.9)
Monocytes: 10 %
Neutrophils Absolute: 4.5 10*3/uL (ref 1.4–7.0)
Neutrophils: 54 %
Platelets: 437 10*3/uL (ref 150–450)
RBC: 4.93 x10E6/uL (ref 3.77–5.28)
RDW: 15.3 % (ref 11.7–15.4)
WBC: 8.3 10*3/uL (ref 3.4–10.8)

## 2022-12-17 LAB — IRON,TIBC AND FERRITIN PANEL
Ferritin: 138 ng/mL (ref 15–150)
Iron Saturation: 16 % (ref 15–55)
Iron: 44 ug/dL (ref 27–159)
Total Iron Binding Capacity: 276 ug/dL (ref 250–450)
UIBC: 232 ug/dL (ref 131–425)

## 2023-03-01 ENCOUNTER — Other Ambulatory Visit (HOSPITAL_COMMUNITY): Payer: Self-pay

## 2023-06-17 ENCOUNTER — Encounter (INDEPENDENT_AMBULATORY_CARE_PROVIDER_SITE_OTHER): Payer: Self-pay

## 2023-06-17 ENCOUNTER — Other Ambulatory Visit: Payer: Self-pay | Admitting: Orthopaedic Surgery

## 2023-06-18 ENCOUNTER — Other Ambulatory Visit (HOSPITAL_COMMUNITY): Payer: Self-pay

## 2023-06-18 ENCOUNTER — Other Ambulatory Visit: Payer: Self-pay

## 2023-06-18 MED ORDER — CELECOXIB 200 MG PO CAPS
200.0000 mg | ORAL_CAPSULE | Freq: Two times a day (BID) | ORAL | 1 refills | Status: AC | PRN
  Filled 2023-06-18: qty 60, 30d supply, fill #0

## 2023-08-10 ENCOUNTER — Other Ambulatory Visit (HOSPITAL_COMMUNITY): Payer: Self-pay

## 2023-08-10 DIAGNOSIS — D2222 Melanocytic nevi of left ear and external auricular canal: Secondary | ICD-10-CM | POA: Diagnosis not present

## 2023-08-10 DIAGNOSIS — D2272 Melanocytic nevi of left lower limb, including hip: Secondary | ICD-10-CM | POA: Diagnosis not present

## 2023-08-10 DIAGNOSIS — L821 Other seborrheic keratosis: Secondary | ICD-10-CM | POA: Diagnosis not present

## 2023-08-10 DIAGNOSIS — D225 Melanocytic nevi of trunk: Secondary | ICD-10-CM | POA: Diagnosis not present

## 2023-08-10 DIAGNOSIS — L918 Other hypertrophic disorders of the skin: Secondary | ICD-10-CM | POA: Diagnosis not present

## 2023-08-10 DIAGNOSIS — D2262 Melanocytic nevi of left upper limb, including shoulder: Secondary | ICD-10-CM | POA: Diagnosis not present

## 2023-08-10 DIAGNOSIS — L814 Other melanin hyperpigmentation: Secondary | ICD-10-CM | POA: Diagnosis not present

## 2023-08-10 DIAGNOSIS — L2089 Other atopic dermatitis: Secondary | ICD-10-CM | POA: Diagnosis not present

## 2023-08-10 DIAGNOSIS — D1801 Hemangioma of skin and subcutaneous tissue: Secondary | ICD-10-CM | POA: Diagnosis not present

## 2023-08-10 MED ORDER — TRIAMCINOLONE ACETONIDE 0.1 % EX CREA
1.0000 | TOPICAL_CREAM | Freq: Two times a day (BID) | CUTANEOUS | 3 refills | Status: AC | PRN
  Filled 2023-08-10: qty 30, 15d supply, fill #0

## 2023-10-05 IMAGING — MG DIGITAL SCREENING BREAST BILAT IMPLANT W/ TOMO W/ CAD
8 of 12 series · 8 of 28 positions shown · non-contrast
Comparison: Previous exam(s).

ACR Breast Density Category a: The breast tissue is almost entirely
fatty.

CLINICAL DATA: Screening.

EXAM:
DIGITAL SCREENING BILATERAL MAMMOGRAM WITH IMPLANTS, CAD AND
TOMOSYNTHESIS
TECHNIQUE: Bilateral screening digital craniocaudal and mediolateral oblique
mammograms were obtained. Bilateral screening digital breast
tomosynthesis was performed. The images were evaluated with
computer-aided detection. Standard and/or implant displaced views
were performed.

[R CC]
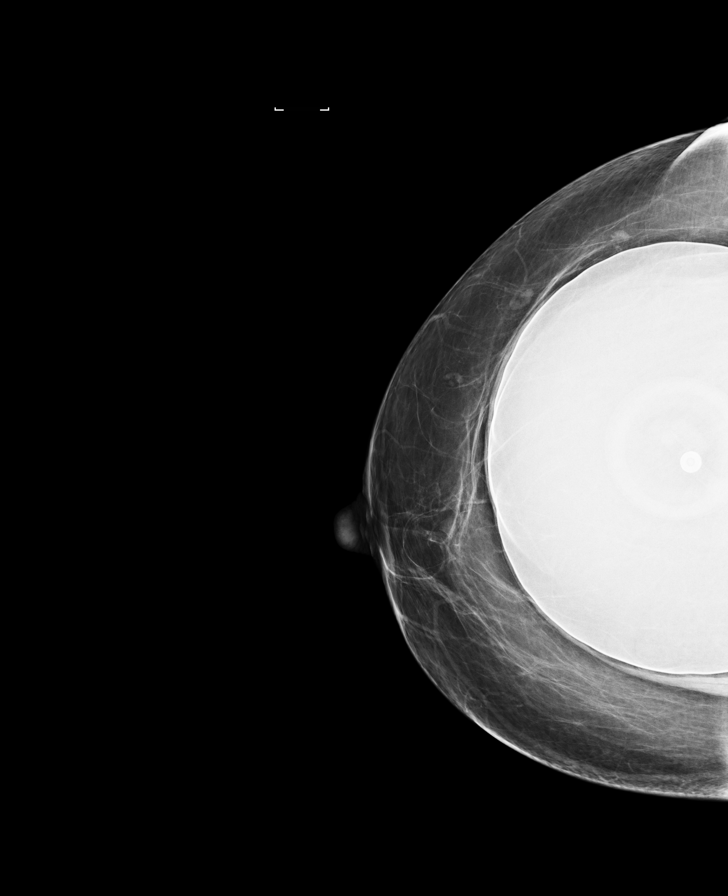

[L CC]
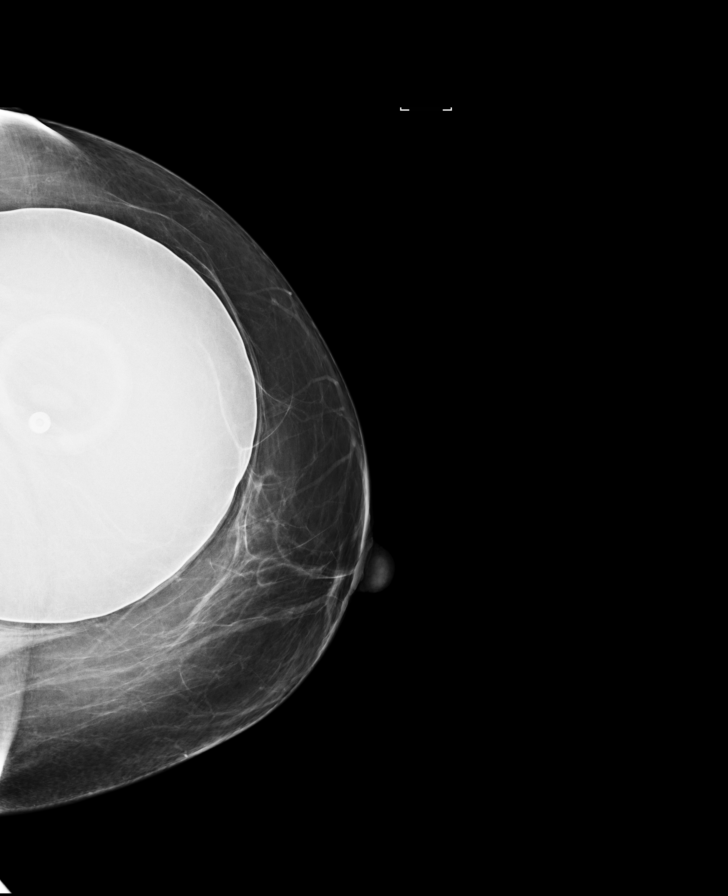

[R MLO]
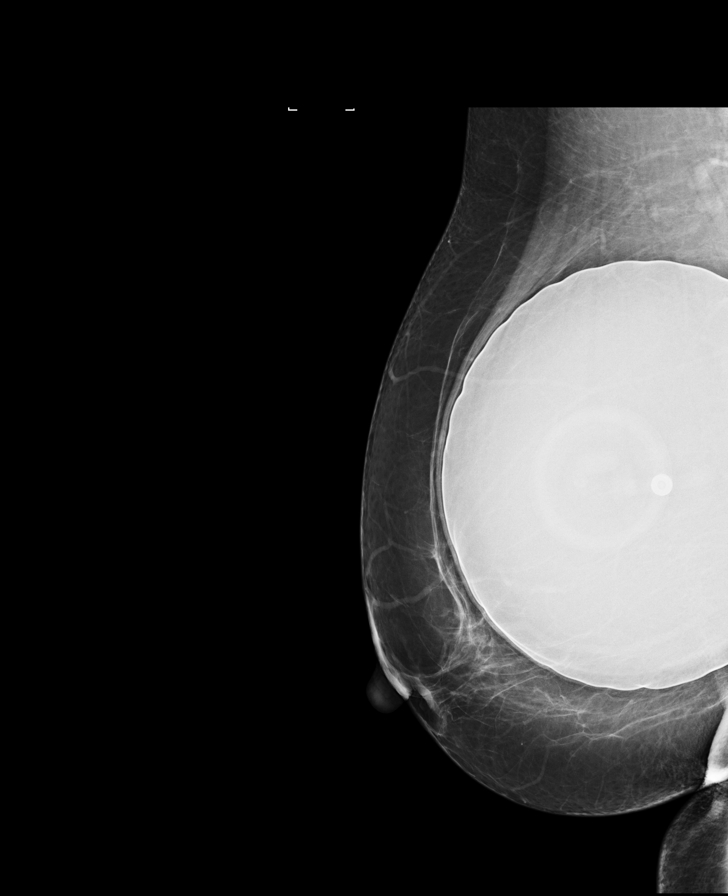

[L MLO]
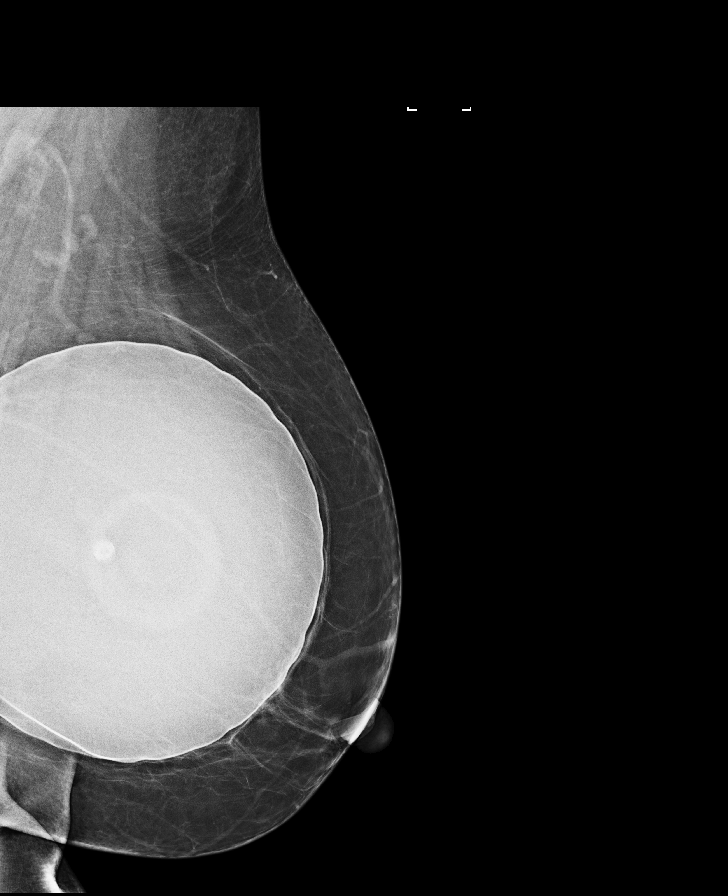

[L CC synth-2D]
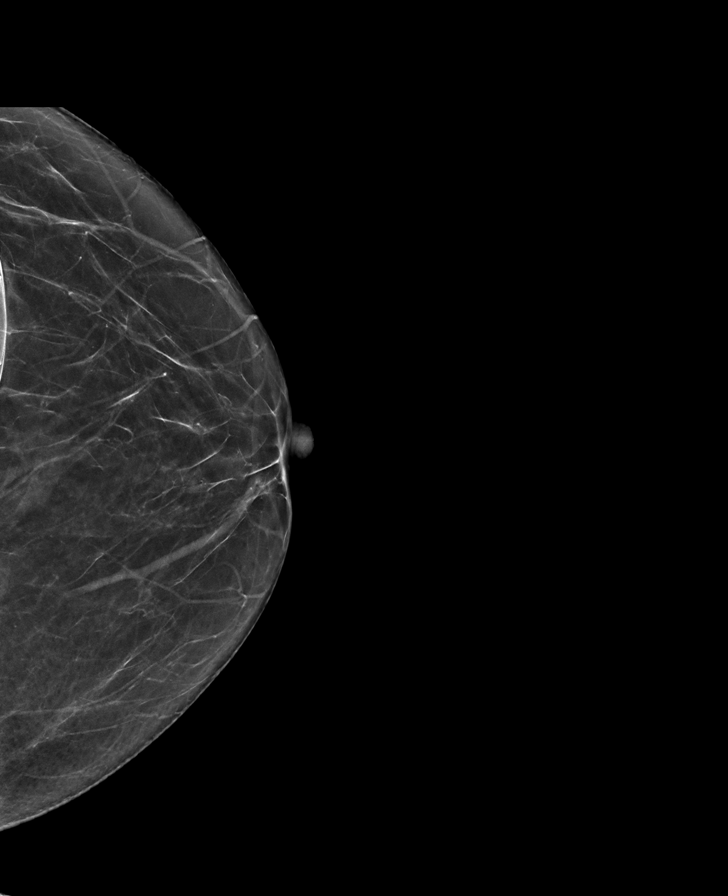

[L MLO synth-2D]
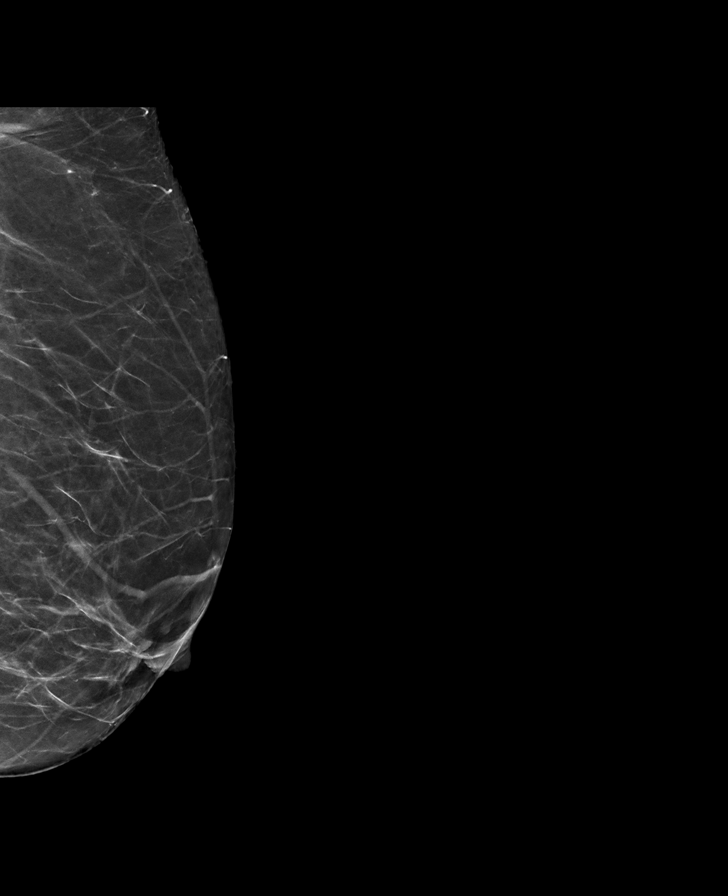

[R CC synth-2D]
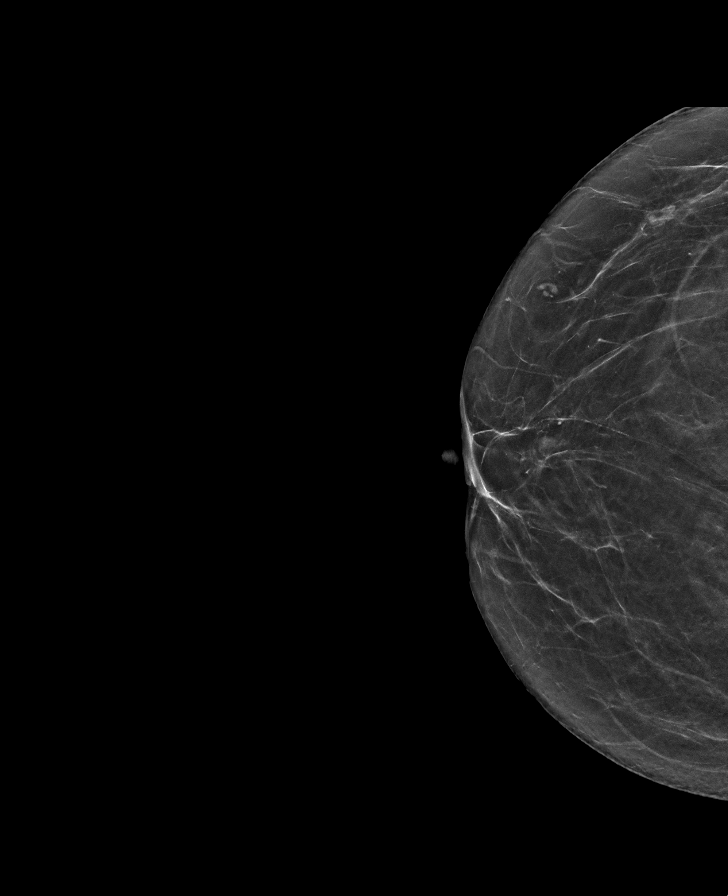

[R MLO synth-2D]
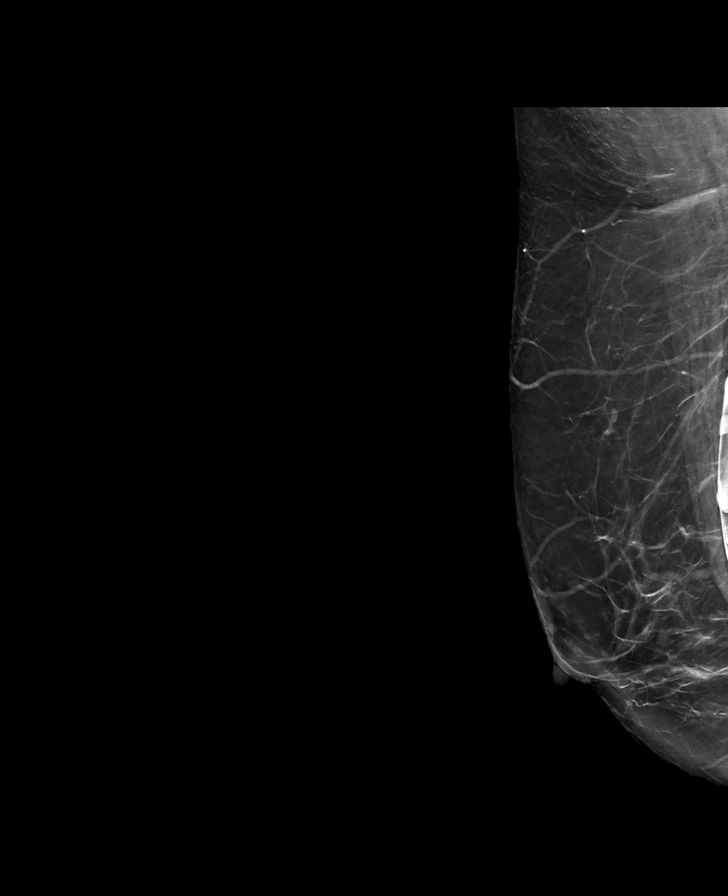

[8 of 28 positions shown; findings below may reference images not displayed]

FINDINGS: The patient has retropectoral implants. There are no findings
suspicious for malignancy.
IMPRESSION: No mammographic evidence of malignancy. A result letter of this
screening mammogram will be mailed directly to the patient.

RECOMMENDATION:
Screening mammogram in one year. (Code:10-T-P7A)

BI-RADS CATEGORY  1:  Negative.

## 2023-11-16 ENCOUNTER — Encounter: Payer: 59 | Admitting: Nurse Practitioner

## 2023-11-20 ENCOUNTER — Other Ambulatory Visit: Payer: Self-pay | Admitting: Medical Genetics

## 2023-11-20 DIAGNOSIS — Z006 Encounter for examination for normal comparison and control in clinical research program: Secondary | ICD-10-CM

## 2024-01-11 ENCOUNTER — Other Ambulatory Visit (HOSPITAL_COMMUNITY): Payer: Self-pay

## 2024-01-11 MED ORDER — FLUZONE 0.5 ML IM SUSY
0.5000 mL | PREFILLED_SYRINGE | Freq: Once | INTRAMUSCULAR | 0 refills | Status: AC
  Filled 2024-01-11: qty 0.5, 1d supply, fill #0

## 2024-01-11 MED ORDER — PNEUMOCOCCAL 20-VAL CONJ VACC 0.5 ML IM SUSY
0.5000 mL | PREFILLED_SYRINGE | Freq: Once | INTRAMUSCULAR | 0 refills | Status: AC
  Filled 2024-01-11: qty 0.5, 1d supply, fill #0

## 2024-01-25 ENCOUNTER — Other Ambulatory Visit: Payer: Self-pay | Admitting: Medical Genetics

## 2024-01-25 DIAGNOSIS — Z006 Encounter for examination for normal comparison and control in clinical research program: Secondary | ICD-10-CM

## 2024-02-01 ENCOUNTER — Encounter: Payer: Self-pay | Admitting: Radiology
